# Patient Record
Sex: Female | Born: 1965 | Race: White | Hispanic: No | Marital: Married | Smoking: Never smoker
Health system: Southern US, Community
[De-identification: ages and names within clinical notes are randomized; demographics above are authoritative.]

## PROBLEM LIST (undated history)

## (undated) DIAGNOSIS — J069 Acute upper respiratory infection, unspecified: Secondary | ICD-10-CM

## (undated) DIAGNOSIS — T7840XA Allergy, unspecified, initial encounter: Secondary | ICD-10-CM

## (undated) DIAGNOSIS — J45909 Unspecified asthma, uncomplicated: Secondary | ICD-10-CM

## (undated) DIAGNOSIS — C50919 Malignant neoplasm of unspecified site of unspecified female breast: Secondary | ICD-10-CM

## (undated) HISTORY — DX: Allergy, unspecified, initial encounter: T78.40XA

## (undated) HISTORY — DX: Acute upper respiratory infection, unspecified: J06.9

## (undated) HISTORY — PX: COLONOSCOPY: SHX174

## (undated) HISTORY — PX: MASTECTOMY: SHX3

## (undated) HISTORY — DX: Unspecified asthma, uncomplicated: J45.909

## (undated) HISTORY — DX: Malignant neoplasm of unspecified site of unspecified female breast: C50.919

---

## 2004-03-05 DIAGNOSIS — C50919 Malignant neoplasm of unspecified site of unspecified female breast: Secondary | ICD-10-CM

## 2004-03-05 HISTORY — DX: Malignant neoplasm of unspecified site of unspecified female breast: C50.919

## 2007-01-08 ENCOUNTER — Other Ambulatory Visit: Admission: RE | Admit: 2007-01-08 | Discharge: 2007-01-08 | Payer: Self-pay | Admitting: Gynecology

## 2007-12-15 ENCOUNTER — Other Ambulatory Visit: Admission: RE | Admit: 2007-12-15 | Discharge: 2007-12-15 | Payer: Self-pay | Admitting: Gynecology

## 2009-03-31 ENCOUNTER — Encounter: Admission: RE | Admit: 2009-03-31 | Discharge: 2009-03-31 | Payer: Self-pay | Admitting: Gynecology

## 2016-08-20 DIAGNOSIS — M23301 Other meniscus derangements, unspecified lateral meniscus, left knee: Secondary | ICD-10-CM | POA: Diagnosis not present

## 2016-08-20 DIAGNOSIS — M25562 Pain in left knee: Secondary | ICD-10-CM | POA: Diagnosis not present

## 2016-09-07 DIAGNOSIS — M25562 Pain in left knee: Secondary | ICD-10-CM | POA: Diagnosis not present

## 2016-09-11 DIAGNOSIS — M25562 Pain in left knee: Secondary | ICD-10-CM | POA: Diagnosis not present

## 2016-09-14 DIAGNOSIS — M25562 Pain in left knee: Secondary | ICD-10-CM | POA: Diagnosis not present

## 2016-09-17 DIAGNOSIS — M25562 Pain in left knee: Secondary | ICD-10-CM | POA: Diagnosis not present

## 2016-09-19 DIAGNOSIS — M25562 Pain in left knee: Secondary | ICD-10-CM | POA: Diagnosis not present

## 2016-09-24 DIAGNOSIS — M25562 Pain in left knee: Secondary | ICD-10-CM | POA: Diagnosis not present

## 2016-09-26 DIAGNOSIS — M25562 Pain in left knee: Secondary | ICD-10-CM | POA: Diagnosis not present

## 2016-10-01 DIAGNOSIS — M25562 Pain in left knee: Secondary | ICD-10-CM | POA: Diagnosis not present

## 2016-10-04 DIAGNOSIS — M25562 Pain in left knee: Secondary | ICD-10-CM | POA: Diagnosis not present

## 2016-10-11 DIAGNOSIS — M25562 Pain in left knee: Secondary | ICD-10-CM | POA: Diagnosis not present

## 2016-10-15 DIAGNOSIS — M25562 Pain in left knee: Secondary | ICD-10-CM | POA: Diagnosis not present

## 2016-10-17 DIAGNOSIS — M25562 Pain in left knee: Secondary | ICD-10-CM | POA: Diagnosis not present

## 2016-10-22 DIAGNOSIS — M25562 Pain in left knee: Secondary | ICD-10-CM | POA: Diagnosis not present

## 2016-10-25 DIAGNOSIS — M25562 Pain in left knee: Secondary | ICD-10-CM | POA: Diagnosis not present

## 2016-10-29 DIAGNOSIS — M25562 Pain in left knee: Secondary | ICD-10-CM | POA: Diagnosis not present

## 2016-11-01 DIAGNOSIS — M25562 Pain in left knee: Secondary | ICD-10-CM | POA: Diagnosis not present

## 2016-11-06 DIAGNOSIS — M25562 Pain in left knee: Secondary | ICD-10-CM | POA: Diagnosis not present

## 2016-11-09 DIAGNOSIS — M25562 Pain in left knee: Secondary | ICD-10-CM | POA: Diagnosis not present

## 2016-11-12 DIAGNOSIS — M25562 Pain in left knee: Secondary | ICD-10-CM | POA: Diagnosis not present

## 2016-11-16 DIAGNOSIS — M25562 Pain in left knee: Secondary | ICD-10-CM | POA: Diagnosis not present

## 2016-11-20 DIAGNOSIS — M25562 Pain in left knee: Secondary | ICD-10-CM | POA: Diagnosis not present

## 2016-11-20 DIAGNOSIS — Z23 Encounter for immunization: Secondary | ICD-10-CM | POA: Diagnosis not present

## 2016-11-23 DIAGNOSIS — M25562 Pain in left knee: Secondary | ICD-10-CM | POA: Diagnosis not present

## 2016-11-26 DIAGNOSIS — M25562 Pain in left knee: Secondary | ICD-10-CM | POA: Diagnosis not present

## 2016-11-30 DIAGNOSIS — M25562 Pain in left knee: Secondary | ICD-10-CM | POA: Diagnosis not present

## 2016-12-03 DIAGNOSIS — M25562 Pain in left knee: Secondary | ICD-10-CM | POA: Diagnosis not present

## 2016-12-06 DIAGNOSIS — M25562 Pain in left knee: Secondary | ICD-10-CM | POA: Diagnosis not present

## 2016-12-10 DIAGNOSIS — M25562 Pain in left knee: Secondary | ICD-10-CM | POA: Diagnosis not present

## 2016-12-14 DIAGNOSIS — M25562 Pain in left knee: Secondary | ICD-10-CM | POA: Diagnosis not present

## 2016-12-18 DIAGNOSIS — M25562 Pain in left knee: Secondary | ICD-10-CM | POA: Diagnosis not present

## 2016-12-21 DIAGNOSIS — M25562 Pain in left knee: Secondary | ICD-10-CM | POA: Diagnosis not present

## 2017-02-18 DIAGNOSIS — Z01419 Encounter for gynecological examination (general) (routine) without abnormal findings: Secondary | ICD-10-CM | POA: Diagnosis not present

## 2017-02-18 DIAGNOSIS — Z1212 Encounter for screening for malignant neoplasm of rectum: Secondary | ICD-10-CM | POA: Diagnosis not present

## 2017-02-18 DIAGNOSIS — Z6824 Body mass index (BMI) 24.0-24.9, adult: Secondary | ICD-10-CM | POA: Diagnosis not present

## 2017-03-14 DIAGNOSIS — Z800 Family history of malignant neoplasm of digestive organs: Secondary | ICD-10-CM | POA: Diagnosis not present

## 2017-03-14 DIAGNOSIS — Z808 Family history of malignant neoplasm of other organs or systems: Secondary | ICD-10-CM | POA: Diagnosis not present

## 2017-03-14 DIAGNOSIS — Z803 Family history of malignant neoplasm of breast: Secondary | ICD-10-CM | POA: Diagnosis not present

## 2017-03-14 DIAGNOSIS — Z853 Personal history of malignant neoplasm of breast: Secondary | ICD-10-CM | POA: Diagnosis not present

## 2017-04-25 DIAGNOSIS — Z809 Family history of malignant neoplasm, unspecified: Secondary | ICD-10-CM | POA: Diagnosis not present

## 2017-06-06 DIAGNOSIS — D225 Melanocytic nevi of trunk: Secondary | ICD-10-CM | POA: Diagnosis not present

## 2017-06-06 DIAGNOSIS — Z08 Encounter for follow-up examination after completed treatment for malignant neoplasm: Secondary | ICD-10-CM | POA: Diagnosis not present

## 2017-06-06 DIAGNOSIS — Z85828 Personal history of other malignant neoplasm of skin: Secondary | ICD-10-CM | POA: Diagnosis not present

## 2017-06-17 DIAGNOSIS — Z0000 Encounter for general adult medical examination without abnormal findings: Secondary | ICD-10-CM | POA: Diagnosis not present

## 2017-06-17 DIAGNOSIS — Z23 Encounter for immunization: Secondary | ICD-10-CM | POA: Diagnosis not present

## 2017-06-17 DIAGNOSIS — Z800 Family history of malignant neoplasm of digestive organs: Secondary | ICD-10-CM | POA: Diagnosis not present

## 2017-06-17 DIAGNOSIS — Z853 Personal history of malignant neoplasm of breast: Secondary | ICD-10-CM | POA: Diagnosis not present

## 2017-06-17 DIAGNOSIS — J45909 Unspecified asthma, uncomplicated: Secondary | ICD-10-CM | POA: Diagnosis not present

## 2017-08-20 DIAGNOSIS — Z111 Encounter for screening for respiratory tuberculosis: Secondary | ICD-10-CM | POA: Diagnosis not present

## 2018-04-07 DIAGNOSIS — Z01419 Encounter for gynecological examination (general) (routine) without abnormal findings: Secondary | ICD-10-CM | POA: Diagnosis not present

## 2018-04-07 DIAGNOSIS — Z6824 Body mass index (BMI) 24.0-24.9, adult: Secondary | ICD-10-CM | POA: Diagnosis not present

## 2018-04-09 DIAGNOSIS — R509 Fever, unspecified: Secondary | ICD-10-CM | POA: Diagnosis not present

## 2018-04-23 DIAGNOSIS — B349 Viral infection, unspecified: Secondary | ICD-10-CM | POA: Diagnosis not present

## 2018-06-20 DIAGNOSIS — J4530 Mild persistent asthma, uncomplicated: Secondary | ICD-10-CM | POA: Diagnosis not present

## 2018-06-20 DIAGNOSIS — F5104 Psychophysiologic insomnia: Secondary | ICD-10-CM | POA: Diagnosis not present

## 2018-06-20 DIAGNOSIS — Z853 Personal history of malignant neoplasm of breast: Secondary | ICD-10-CM | POA: Diagnosis not present

## 2018-06-20 DIAGNOSIS — J3089 Other allergic rhinitis: Secondary | ICD-10-CM | POA: Diagnosis not present

## 2018-06-23 DIAGNOSIS — H8110 Benign paroxysmal vertigo, unspecified ear: Secondary | ICD-10-CM | POA: Diagnosis not present

## 2018-06-23 DIAGNOSIS — F5104 Psychophysiologic insomnia: Secondary | ICD-10-CM | POA: Diagnosis not present

## 2018-06-30 ENCOUNTER — Other Ambulatory Visit: Payer: Self-pay

## 2018-06-30 ENCOUNTER — Ambulatory Visit (INDEPENDENT_AMBULATORY_CARE_PROVIDER_SITE_OTHER): Payer: BLUE CROSS/BLUE SHIELD | Admitting: Pediatrics

## 2018-06-30 ENCOUNTER — Encounter: Payer: Self-pay | Admitting: Pediatrics

## 2018-06-30 VITALS — BP 90/60 | HR 71 | Temp 98.2°F | Ht 66.4 in | Wt 155.6 lb

## 2018-06-30 DIAGNOSIS — J301 Allergic rhinitis due to pollen: Secondary | ICD-10-CM | POA: Diagnosis not present

## 2018-06-30 DIAGNOSIS — T63441A Toxic effect of venom of bees, accidental (unintentional), initial encounter: Secondary | ICD-10-CM | POA: Insufficient documentation

## 2018-06-30 DIAGNOSIS — J4530 Mild persistent asthma, uncomplicated: Secondary | ICD-10-CM | POA: Insufficient documentation

## 2018-06-30 DIAGNOSIS — Z853 Personal history of malignant neoplasm of breast: Secondary | ICD-10-CM | POA: Diagnosis not present

## 2018-06-30 DIAGNOSIS — T63441D Toxic effect of venom of bees, accidental (unintentional), subsequent encounter: Secondary | ICD-10-CM | POA: Diagnosis not present

## 2018-06-30 MED ORDER — FLUTICASONE-SALMETEROL 100-50 MCG/DOSE IN AEPB: INHALATION_SPRAY | RESPIRATORY_TRACT | 5 refills | Status: DC

## 2018-06-30 MED ORDER — ALBUTEROL SULFATE HFA 108 (90 BASE) MCG/ACT IN AERS: INHALATION_SPRAY | RESPIRATORY_TRACT | 3 refills | Status: DC

## 2018-06-30 MED ORDER — FLUTICASONE PROPIONATE 50 MCG/ACT NA SUSP: NASAL | 5 refills | Status: DC

## 2018-06-30 MED ORDER — EPINEPHRINE 0.3 MG/0.3ML IJ SOAJ: INTRAMUSCULAR | 0 refills | Status: DC

## 2018-06-30 MED ORDER — MONTELUKAST SODIUM 10 MG PO TABS: ORAL_TABLET | ORAL | 5 refills | Status: DC

## 2018-06-30 NOTE — Patient Instructions (Addendum)
Environmental control of dust mite Zyrtec 10 mg-take 1 tablet once a day for runny nose or itchy eyes Fluticasone 2 sprays per nostril once a day if needed for stuffy nose Opcon-A-1 drop 3 times a day if needed for itchy eyes Montelukast 10 mg-take 1 tablet once a day to prevent coughing or wheezing Wixela  inhaler 100/50-1 puff every 12 hours to prevent coughing or wheezing Pro-air 2 puffs every 4 hours if needed for wheezing or coughing spells.  You may use Pro-air 2 puffs 5 to 15 minutes before exercise Continue on your other medications Call us if you are not doing well on this treatment plan I will call you with the results of your blood work for insect allergy If your symptoms get worse, add prednisone 10 mg twice a day for 4 days, 10 mg on the fifth day  If you have  an allergic reaction take Benadryl 50 mg every 4 hours and if you  has life-threatening symptoms inject with EpiPen 0.3 mg

## 2018-06-30 NOTE — Progress Notes (Signed)
[address] [place] [address] Dept: [phone]  New Patient Note  Patient ID: [name], female    DOB: [birth year]  Age: 53 y.o. MRN: [id number] Date of Office Visit: 06/30/2018 Referring provider: No referring provider defined for this encounter.    Chief Complaint: Establish Care (wants to see if anything has changed)  HPI [employer] presents for an allergy evaluation.  She has had asthma for over 30 years.  She has had seasonal allergic rhinitis since childhood.  She has aggravation of her nasal symptoms on exposure to dust, cigarette smoke, the springtime of the year and possibly cats and dogs.. At times  she has itchy eyes.  She has never had pneumonia , gastroesophageal reflux, eczema or food allergies.  She is on Zyrtec 10 mg once a day, montelukast 10 mg once a day and Wixela  100-50-one  puff once a day on a daily basis.  She had breast cancer in 2006 and had a bilateral mastectomy.  She did not need chemotherapy or radiation   Review of Systems  Constitutional: Negative.   HENT:       Seasonal allergic rhinitis since childhood  Eyes:       Itch in the springtime  Respiratory:       Asthma for over 20 years  Cardiovascular: Negative.   Gastrointestinal: Negative.   Genitourinary:       Bilateral mastectomy 2006 for breast cancer.  No chemotherapy or radiation needed  Musculoskeletal:       Knee arthroscopy 2018  Skin:       Hives from an insect sting  at age 84  Neurological: Negative.   Endo/Heme/Allergies:       No diabetes or thyroid disease  Psychiatric/Behavioral: Negative.     Outpatient Encounter Medications as of 06/30/2018  Medication Sig  . Cetirizine HCl (ZYRTEC ALLERGY) 10 MG CAPS Zyrtec 10 mg capsule  Take by oral route.  Marland Kitchen EPINEPHrine 0.3 mg/0.3 mL IJ SOAJ injection For life-threatening symptoms inject.  . Fluticasone-Salmeterol (WIXELA INHUB) 100-50 MCG/DOSE AEPB 1 puff every 12 hours to prevent coughing or wheezing.  . mometasone  (NASONEX) 50 MCG/ACT nasal spray 2 sprays by Each Nare route daily as needed.  . montelukast (SINGULAIR) 10 MG tablet Take 1 tablet once a day to prevent coughing or wheezing.  . [DISCONTINUED] EPINEPHrine 0.3 mg/0.3 mL IJ SOAJ injection epinephrine 0.3 mg/0.3 mL injection, auto-injector  ADM 0.3 ML [place] 1 TIME UTD FOR SEVERE ALLERGIC REACTION  . [DISCONTINUED] Fluticasone-Salmeterol (WIXELA INHUB) 100-50 MCG/DOSE AEPB Wixela Inhub 100 mcg-50 mcg/dose powder for inhalation  . [DISCONTINUED] montelukast (SINGULAIR) 10 MG tablet   . albuterol (PROAIR HFA) 108 (90 Base) MCG/ACT inhaler 2 puffs every 4 hours if needed for wheezing or coughing spells.  . fluticasone (FLONASE) 50 MCG/ACT nasal spray 2 sprays per nostril once a day if needed for stuffy nose.   No facility-administered encounter medications on file as of 06/30/2018.      Drug Allergies:  Allergies  Allergen Reactions  . Bee Venom Hives and Rash    Family History: Yessika's family history includes Allergic rhinitis in her father; Asthma in her father and maternal grandmother..  Family history is positive for food allergies and eczema.  Family history is negative for angioedema, chronic urticaria , chronic bronchitis or emphysema.  Social and environmental.  They have a poodle  at home.  She is not exposed to cigarette smoking.  She has not smoked cigarettes in the past.  She plays golf as a hobby.  She is not employed  Physical Exam: BP 90/60 (BP Location: Left Arm, Patient Position: Sitting, Cuff Size: Normal)   Pulse 71   Temp 98.2 F (36.8 C) (Oral)   Ht 5' 6.4" (1.687 m)   Wt 155 lb 9.6 oz (70.6 kg)   SpO2 96%   BMI 24.81 kg/m    Physical Exam Constitutional:      Appearance: Normal appearance. She is normal weight.  HENT:     Head:     Comments: Eyes normal.  Ears normal.  Nose  mild swelling of the nasal turbinates.  Pharynx normal. Neck:     Musculoskeletal: Neck supple.     Comments: No thyromegaly Pulmonary:      Comments: Clear to percussion and auscultation Abdominal:     Palpations: Abdomen is soft.     Tenderness: There is no abdominal tenderness.     Comments: No hepatosplenomegaly  Lymphadenopathy:     Cervical: No cervical adenopathy.  Skin:    Comments: Clear  Neurological:     General: No focal deficit present.     Mental Status: She is alert and oriented to person, place, and time.  Psychiatric:        Mood and Affect: Mood normal.        Behavior: Behavior normal.        Thought Content: Thought content normal.        Judgment: Judgment normal.     Diagnostics: FVC 3.29 L FEV1 2.81 L.  Predicted FVC 3.79 L predicted FEV1 2.99 L.  After albuterol 2 puffs FVC 3.46 L FEV1 2.97 L the spirometry is in the normal range and there was no significant improvement after albuterol  Allergy skin test were positive to grass pollen, weeds, tree pollens, cat, dust mite   Assessment  Assessment and Plan: 1. Toxic effect of venom of bees, unintentional, subsequent encounter   2. Mild persistent asthma without complication   3. Seasonal allergic rhinitis due to pollen   4. History of breast cancer     Meds ordered this encounter  Medications  . fluticasone (FLONASE) 50 MCG/ACT nasal spray    Sig: 2 sprays per nostril once a day if needed for stuffy nose.    Dispense:  16 g    Refill:  5  . montelukast (SINGULAIR) 10 MG tablet    Sig: Take 1 tablet once a day to prevent coughing or wheezing.    Dispense:  30 tablet    Refill:  5  . Fluticasone-Salmeterol (WIXELA INHUB) 100-50 MCG/DOSE AEPB    Sig: 1 puff every 12 hours to prevent coughing or wheezing.    Dispense:  60 each    Refill:  5  . albuterol (PROAIR HFA) 108 (90 Base) MCG/ACT inhaler    Sig: 2 puffs every 4 hours if needed for wheezing or coughing spells.    Dispense:  1 Inhaler    Refill:  3  . EPINEPHrine 0.3 mg/0.3 mL IJ SOAJ injection    Sig: For life-threatening symptoms inject.    Dispense:  2 Device    Refill:  0     Patient Instructions  Environmental control of dust mite Zyrtec 10 mg-take 1 tablet once a day for runny nose or itchy eyes Fluticasone 2 sprays per nostril once a day if needed for stuffy nose Opcon-A-1 drop 3 times a day if needed for itchy eyes Montelukast 10 mg-take 1 tablet once a day to  prevent coughing or wheezing Wixela  inhaler 100/50-1 puff every 12 hours to prevent coughing or wheezing Pro-air 2 puffs every 4 hours if needed for wheezing or coughing spells.  You may use Pro-air 2 puffs 5 to 15 minutes before exercise Continue on your other medications Call us if you are not doing well on this treatment plan I will call you with the results of your blood work for insect allergy If your symptoms get worse, add prednisone 10 mg twice a day for 4 days, 10 mg on the fifth day  If you have  an allergic reaction take Benadryl 50 mg every 4 hours and if you  has life-threatening symptoms inject with EpiPen 0.3 mg   Return in about 6 weeks (around 08/11/2018).   Thank you for the opportunity to care for this patient.  Please do not hesitate to contact me with questions.  Penne Lash, M.D.  [facility] [address] [place], [place] [address] [phone]

## 2018-07-02 LAB — ALLERGEN HYMENOPTERA PANEL
Bumblebee: <0.1 kU/L
Honeybee IgE: <0.1 kU/L
Hornet, White Face, IgE: 0.16 kU/L — AB
Hornet, Yellow, IgE: <0.1 kU/L
Paper Wasp IgE: 0.11 kU/L — AB
Yellow Jacket, IgE: 0.48 kU/L — AB

## 2018-10-07 ENCOUNTER — Other Ambulatory Visit: Payer: Self-pay | Admitting: *Deleted

## 2018-10-07 NOTE — Telephone Encounter (Signed)
Received PA for wixela 100-50 mcg. Spoke with pharmacy and her ins prefers brand Advair now which will be $10. Pt made aware of change.

## 2019-02-12 ENCOUNTER — Other Ambulatory Visit: Payer: Self-pay | Admitting: Pediatrics

## 2019-02-12 DIAGNOSIS — J301 Allergic rhinitis due to pollen: Secondary | ICD-10-CM

## 2019-02-12 DIAGNOSIS — L570 Actinic keratosis: Secondary | ICD-10-CM | POA: Diagnosis not present

## 2019-02-12 DIAGNOSIS — D1801 Hemangioma of skin and subcutaneous tissue: Secondary | ICD-10-CM | POA: Diagnosis not present

## 2019-02-12 DIAGNOSIS — L821 Other seborrheic keratosis: Secondary | ICD-10-CM | POA: Diagnosis not present

## 2019-02-12 DIAGNOSIS — D225 Melanocytic nevi of trunk: Secondary | ICD-10-CM | POA: Diagnosis not present

## 2019-02-12 DIAGNOSIS — D485 Neoplasm of uncertain behavior of skin: Secondary | ICD-10-CM | POA: Diagnosis not present

## 2019-02-12 DIAGNOSIS — D2271 Melanocytic nevi of right lower limb, including hip: Secondary | ICD-10-CM | POA: Diagnosis not present

## 2019-03-09 DIAGNOSIS — D485 Neoplasm of uncertain behavior of skin: Secondary | ICD-10-CM | POA: Diagnosis not present

## 2019-03-09 DIAGNOSIS — L988 Other specified disorders of the skin and subcutaneous tissue: Secondary | ICD-10-CM | POA: Diagnosis not present

## 2019-03-19 DIAGNOSIS — Z4802 Encounter for removal of sutures: Secondary | ICD-10-CM | POA: Diagnosis not present

## 2019-03-27 ENCOUNTER — Ambulatory Visit: Payer: BC Managed Care – PPO | Attending: Internal Medicine

## 2019-03-27 DIAGNOSIS — Z23 Encounter for immunization: Secondary | ICD-10-CM | POA: Insufficient documentation

## 2019-03-27 NOTE — Progress Notes (Signed)
   Covid-19 Vaccination Clinic  Name:  Vanessa Goodman    MRN: [id number] DOB: 01/23/1966  03/27/2019  Vanessa Goodman was observed post Covid-19 immunization for 15 minutes without incidence. She was provided with Vaccine Information Sheet and instruction to access the V-Safe system.   Vanessa Goodman was instructed to call 911 with any severe reactions post vaccine: Marland Kitchen Difficulty breathing  . Swelling of your face and throat  . A fast heartbeat  . A bad rash all over your body  . Dizziness and weakness    Immunizations Administered    Name Date Dose VIS Date Route   Pfizer COVID-19 Vaccine 03/27/2019 12:26 PM 0.3 mL 02/13/2019 Intramuscular   Manufacturer: [place]   Lot: [id number]   [place]: [id number]

## 2019-04-06 DIAGNOSIS — N921 Excessive and frequent menstruation with irregular cycle: Secondary | ICD-10-CM | POA: Diagnosis not present

## 2019-04-06 DIAGNOSIS — Z0000 Encounter for general adult medical examination without abnormal findings: Secondary | ICD-10-CM | POA: Diagnosis not present

## 2019-04-06 DIAGNOSIS — J4530 Mild persistent asthma, uncomplicated: Secondary | ICD-10-CM | POA: Diagnosis not present

## 2019-04-06 DIAGNOSIS — Z13220 Encounter for screening for lipoid disorders: Secondary | ICD-10-CM | POA: Diagnosis not present

## 2019-04-06 DIAGNOSIS — J3089 Other allergic rhinitis: Secondary | ICD-10-CM | POA: Diagnosis not present

## 2019-04-06 DIAGNOSIS — Z853 Personal history of malignant neoplasm of breast: Secondary | ICD-10-CM | POA: Diagnosis not present

## 2019-04-18 ENCOUNTER — Ambulatory Visit: Payer: BC Managed Care – PPO | Attending: Internal Medicine

## 2019-04-18 DIAGNOSIS — Z23 Encounter for immunization: Secondary | ICD-10-CM | POA: Insufficient documentation

## 2019-04-18 NOTE — Progress Notes (Signed)
   Covid-19 Vaccination Clinic  Name:  Nuria Wittich    MRN: [id number] DOB: November 03, 1965  04/18/2019  Ms. Slocumb was observed post Covid-19 immunization for 15 minutes without incidence. She was provided with Vaccine Information Sheet and instruction to access the V-Safe system.   Ms. Aispuro was instructed to call 911 with any severe reactions post vaccine: Marland Kitchen Difficulty breathing  . Swelling of your face and throat  . A fast heartbeat  . A bad rash all over your body  . Dizziness and weakness    Immunizations Administered    Name Date Dose VIS Date Route   Pfizer COVID-19 Vaccine 04/18/2019 10:56 AM 0.3 mL 02/13/2019 Intramuscular   Manufacturer: [place]   Lot: [id number]   [place]: [id number]

## 2019-04-28 DIAGNOSIS — Z6825 Body mass index (BMI) 25.0-25.9, adult: Secondary | ICD-10-CM | POA: Diagnosis not present

## 2019-04-28 DIAGNOSIS — Z01419 Encounter for gynecological examination (general) (routine) without abnormal findings: Secondary | ICD-10-CM | POA: Diagnosis not present

## 2019-04-28 DIAGNOSIS — N920 Excessive and frequent menstruation with regular cycle: Secondary | ICD-10-CM | POA: Diagnosis not present

## 2019-05-07 DIAGNOSIS — N840 Polyp of corpus uteri: Secondary | ICD-10-CM | POA: Diagnosis not present

## 2019-05-07 DIAGNOSIS — N924 Excessive bleeding in the premenopausal period: Secondary | ICD-10-CM | POA: Diagnosis not present

## 2019-05-14 DIAGNOSIS — N920 Excessive and frequent menstruation with regular cycle: Secondary | ICD-10-CM | POA: Diagnosis not present

## 2019-05-14 DIAGNOSIS — N950 Postmenopausal bleeding: Secondary | ICD-10-CM | POA: Diagnosis not present

## 2019-05-14 DIAGNOSIS — N840 Polyp of corpus uteri: Secondary | ICD-10-CM | POA: Diagnosis not present

## 2019-05-18 DIAGNOSIS — D509 Iron deficiency anemia, unspecified: Secondary | ICD-10-CM | POA: Diagnosis not present

## 2019-05-18 DIAGNOSIS — Z1211 Encounter for screening for malignant neoplasm of colon: Secondary | ICD-10-CM | POA: Diagnosis not present

## 2019-05-28 ENCOUNTER — Other Ambulatory Visit: Payer: Self-pay

## 2019-05-28 DIAGNOSIS — J301 Allergic rhinitis due to pollen: Secondary | ICD-10-CM

## 2019-05-28 MED ORDER — MONTELUKAST SODIUM 10 MG PO TABS: ORAL_TABLET | ORAL | 0 refills | Status: DC

## 2019-06-01 ENCOUNTER — Encounter: Payer: Self-pay | Admitting: Family Medicine

## 2019-06-01 ENCOUNTER — Other Ambulatory Visit: Payer: Self-pay

## 2019-06-01 ENCOUNTER — Ambulatory Visit: Payer: BC Managed Care – PPO | Admitting: Family Medicine

## 2019-06-01 VITALS — BP 120/72 | HR 73 | Temp 96.8°F | Resp 18 | Ht 67.5 in | Wt 160.0 lb

## 2019-06-01 DIAGNOSIS — J4530 Mild persistent asthma, uncomplicated: Secondary | ICD-10-CM

## 2019-06-01 DIAGNOSIS — J301 Allergic rhinitis due to pollen: Secondary | ICD-10-CM

## 2019-06-01 DIAGNOSIS — H1010 Acute atopic conjunctivitis, unspecified eye: Secondary | ICD-10-CM | POA: Diagnosis not present

## 2019-06-01 DIAGNOSIS — T63441D Toxic effect of venom of bees, accidental (unintentional), subsequent encounter: Secondary | ICD-10-CM | POA: Diagnosis not present

## 2019-06-01 DIAGNOSIS — Z853 Personal history of malignant neoplasm of breast: Secondary | ICD-10-CM

## 2019-06-01 MED ORDER — MONTELUKAST SODIUM 10 MG PO TABS: ORAL_TABLET | ORAL | 5 refills | Status: DC

## 2019-06-01 MED ORDER — FLUTICASONE-SALMETEROL 100-50 MCG/DOSE IN AEPB: INHALATION_SPRAY | RESPIRATORY_TRACT | 5 refills | Status: DC

## 2019-06-01 NOTE — Progress Notes (Signed)
[place] [id number] Dept: [phone]  FOLLOW UP NOTE  Patient ID: Vanessa Goodman, female    DOB: 22-Feb-1966  Age: 54 y.o. MRN: [id number] Date of Office Visit: 06/01/2019  Assessment  Chief Complaint: Asthma  HPI Vanessa Goodman is a 54 year old female who presents to the clinic for follow-up visit.  She was last seen in this clinic on 06/30/2018 by Dr. Larrie Kass evaluation of asthma, allergic rhinitis, allergic conjunctivitis, insect allergy, and allergic reaction with unknown trigger.  At today's visit, she reports her asthma has been well controlled with no shortness of breath, cough, or wheeze with activity or rest.  She continues montelukast 10 mg daily Wixela100-1 puff once a night and has not needed her albuterol since her last visit to this clinic.  Allergic rhinitis is reported as well controlled with cetirizine daily and Flonase and saline rinses as needed.  Allergic conjunctivitis is reported as moderately well controlled with no allergy eyedrop use at this time.  She continues to avoid stinging insects and has not had a sting or needed treatment since her last visit to this clinic.  She reports she has not had an allergic reaction of any kind since her last visit to this clinic nor has she needed to use her EpiPen.  Her current medications are listed in the chart.   Drug Allergies:  Allergies  Allergen Reactions  . Bee Venom Hives and Rash    Physical Exam: BP 120/72   Pulse 73   Temp (!) 96.8 F (36 C) (Temporal)   Resp 18   Ht 5' 7.5" (1.715 m)   Wt 160 lb (72.6 kg)   SpO2 95%   BMI 24.69 kg/m    Physical Exam Vitals reviewed.  Constitutional:      Appearance: Normal appearance.  HENT:     Head: Normocephalic and atraumatic.     Right Ear: Tympanic membrane normal.     Left Ear: Tympanic membrane normal.     Nose:     Comments: Bilateral nares slightly erythematous with no nasal drainage noted.  Slight nasal deviation noted.  Pharynx slightly  erythematous with no exudate.  Ears normal.  Eyes normal. Eyes:     Conjunctiva/sclera: Conjunctivae normal.  Cardiovascular:     Rate and Rhythm: Normal rate and regular rhythm.     Heart sounds: Normal heart sounds. No murmur.  Pulmonary:     Effort: Pulmonary effort is normal.     Breath sounds: Normal breath sounds.     Comments: Lungs clear to auscultation Musculoskeletal:        General: Normal range of motion.     Cervical back: Normal range of motion and neck supple.  Skin:    General: Skin is warm and dry.  Neurological:     Mental Status: She is alert and oriented to person, place, and time.  Psychiatric:        Mood and Affect: Mood normal.        Behavior: Behavior normal.        Thought Content: Thought content normal.        Judgment: Judgment normal.     Diagnostics: FVC 3.40, FEV1 2.90.  Predicted FVC 4.02, predicted FEV1 3.16.  Spirometry indicates normal ventilatory function.  Assessment and Plan: 1. Mild persistent asthma without complication   2. Seasonal allergic rhinitis due to pollen   3. Seasonal allergic conjunctivitis   4. Toxic effect of venom of bees, unintentional, subsequent encounter   5.  History of breast cancer     Meds ordered this encounter  Medications  . Fluticasone-Salmeterol (WIXELA INHUB) 100-50 MCG/DOSE AEPB    Sig: 1 puff every 12 hours to prevent coughing or wheezing.    Dispense:  60 each    Refill:  5  . montelukast (SINGULAIR) 10 MG tablet    Sig: Take 1 tablet once a day to prevent coughing or wheezing.    Dispense:  30 tablet    Refill:  5    Patient Instructions  Asthma Continue montelukast 10 mg-take 1 tablet once a day to prevent coughing or wheezing Continue Wixela 100-1 puff once a day to prevent coughing or wheezing Continue Pro-air 2 puffs every 4 hours if needed for wheezing or coughing spells.  You may use Pro-air 2 puffs 5 to 15 minutes before exercise Continue on your other medications  Allergic  rhinitis Continue cetirizine 10 mg-take 1 tablet once a day as needed for runny nose or itchy eyes Continue Fluticasone 2 sprays per nostril once a day if needed for stuffy nose Consider saline nasal rinses as needed for nasal symptoms. Use this before any medicated nasal sprays for best result Continue allergen avoidance measures directed toward, grass pollen, tree pollen, weed pollen, cat, and dust mite  Allergic conjunctivitis Begin Opcon-A-1 drop 3 times a day if needed for red or itchy eyes  Stinging insects Avoid stinging insects. In case of an allergic reaction, take Benadryl 50 mg every 4 hours, and if life-threatening symptoms occur, inject with EpiPen 0.3 mg.  Call the clinic if this treatment plan is not working well for you  Follow up in 6 months or sooner if needed.   Return in about 6 months (around 12/02/2019), or if symptoms worsen or fail to improve.   Thank you for the opportunity to care for this patient.  Please do not hesitate to contact me with questions.  Gareth Morgan, FNP [facility]  I have provided oversight concerning Gareth Morgan' evaluation and treatment of this patient's health issues addressed during today's encounter. I agree with the assessment and therapeutic plan as outlined in the note.    Thank you for the opportunity to care for this patient.  Please do not hesitate to contact me with questions.  Penne Lash, M.D.  [facility] [address] [place], [place] [address] [phone]

## 2019-06-01 NOTE — Patient Instructions (Addendum)
Asthma Continue montelukast 10 mg-take 1 tablet once a day to prevent coughing or wheezing Continue Wixela 100-1 puff once a day to prevent coughing or wheezing Continue Pro-air 2 puffs every 4 hours if needed for wheezing or coughing spells.  You may use Pro-air 2 puffs 5 to 15 minutes before exercise Continue on your other medications  Allergic rhinitis Continue cetirizine 10 mg-take 1 tablet once a day as needed for runny nose or itchy eyes Continue Fluticasone 2 sprays per nostril once a day if needed for stuffy nose Consider saline nasal rinses as needed for nasal symptoms. Use this before any medicated nasal sprays for best result Continue allergen avoidance measures directed toward, grass pollen, tree pollen, weed pollen, cat, and dust mite  Allergic conjunctivitis Begin Opcon-A-1 drop 3 times a day if needed for red or itchy eyes  Stinging insects Avoid stinging insects. In case of an allergic reaction, take Benadryl 50 mg every 4 hours, and if life-threatening symptoms occur, inject with EpiPen 0.3 mg.  Call the clinic if this treatment plan is not working well for you  Follow up in 6 months or sooner if needed.

## 2019-06-03 ENCOUNTER — Telehealth: Payer: Self-pay

## 2019-06-03 NOTE — Telephone Encounter (Signed)
Fax received today from [facility].  Wixela Inhub 100-50 mcg is approved.  Authorization effective dates: 06/01/2029 - 05/31/2020  Under pharmacy benefits.

## 2019-06-07 ENCOUNTER — Other Ambulatory Visit: Payer: Self-pay | Admitting: Pediatrics

## 2019-06-07 DIAGNOSIS — J4530 Mild persistent asthma, uncomplicated: Secondary | ICD-10-CM

## 2019-06-29 ENCOUNTER — Other Ambulatory Visit: Payer: Self-pay

## 2019-06-29 DIAGNOSIS — J301 Allergic rhinitis due to pollen: Secondary | ICD-10-CM

## 2019-06-29 MED ORDER — MONTELUKAST SODIUM 10 MG PO TABS: ORAL_TABLET | ORAL | 5 refills | Status: DC

## 2019-11-06 ENCOUNTER — Ambulatory Visit: Payer: BC Managed Care – PPO | Admitting: Family Medicine

## 2019-12-08 NOTE — Patient Instructions (Addendum)
Asthma Continue montelukast 10 mg-take 1 tablet once a day to prevent coughing or wheezing Continue Advair Diskus 100-50. Take 1 puff once a day to prevent coughing or wheezing Continue albuterol 2 puffs every 4 hours if needed for wheezing or coughing spells.   You may use albuterol 2 puffs 5 to 15 minutes before exercise  Allergic rhinitis Continue cetirizine 10 mg-take 1 tablet once a day as needed for runny nose or itchy eyes Continue Flonase 2 sprays per nostril once a day if needed for stuffy nose Consider saline nasal rinses as needed for nasal symptoms. Use this before any medicated nasal sprays for best result Continue allergen avoidance measures directed toward, grass pollen, tree pollen, weed pollen, cat, and dust mite as listed below  Allergic conjunctivitis Some over the counter eye drops include Pataday one drop in each eye once a day as needed for red, itchy eyes OR Zaditor one drop in each eye twice a day as needed for red itchy eyes.  Stinging insects Avoid stinging insects. In case of an allergic reaction, take Benadryl 50 mg every 4 hours, and if life-threatening symptoms occur, inject with EpiPen 0.3 mg.  Call the clinic if this treatment plan is not working well for you  Follow up in 6 months or sooner if needed.  Reducing Pollen Exposure The American Academy of Allergy, Asthma and Immunology suggests the following steps to reduce your exposure to pollen during allergy seasons. 1. Do not hang sheets or clothing out to dry; pollen may collect on these items. 2. Do not mow lawns or spend time around freshly cut grass; mowing stirs up pollen. 3. Keep windows closed at night.  Keep car windows closed while driving. 4. Minimize morning activities outdoors, a time when pollen counts are usually at their highest. 5. Stay indoors as much as possible when pollen counts or humidity is high and on windy days when pollen tends to remain in the air longer. 6. Use air conditioning  when possible.  Many air conditioners have filters that trap the pollen spores. 7. Use a HEPA room air filter to remove pollen form the indoor air you breathe.   Control of Dust Mite Allergen Dust mites play a major role in allergic asthma and rhinitis. They occur in environments with high humidity wherever human skin is found. Dust mites absorb humidity from the atmosphere (ie, they do not drink) and feed on organic matter (including shed human and animal skin). Dust mites are a microscopic type of insect that you cannot see with the naked eye. High levels of dust mites have been detected from mattresses, pillows, carpets, upholstered furniture, bed covers, clothes, soft toys and any woven material. The principal allergen of the dust mite is found in its feces. A gram of dust may contain 1,000 mites and 250,000 fecal particles. Mite antigen is easily measured in the air during house cleaning activities. Dust mites do not bite and do not cause harm to humans, other than by triggering allergies/asthma.  Ways to decrease your exposure to dust mites in your home:  1. Encase mattresses, box springs and pillows with a mite-impermeable barrier or cover  2. Wash sheets, blankets and drapes weekly in hot water (130 F) with detergent and dry them in a dryer on the hot setting.  3. Have the room cleaned frequently with a vacuum cleaner and a damp dust-mop. For carpeting or rugs, vacuuming with a vacuum cleaner equipped with a high-efficiency particulate air (HEPA) filter. The dust  mite allergic individual should not be in a room which is being cleaned and should wait 1 hour after cleaning before going into the room.  4. Do not sleep on upholstered furniture (eg, couches).  5. If possible removing carpeting, upholstered furniture and drapery from the home is ideal. Horizontal blinds should be eliminated in the rooms where the person spends the most time (bedroom, study, television room). Washable vinyl,  roller-type shades are optimal.  6. Remove all non-washable stuffed toys from the bedroom. Wash stuffed toys weekly like sheets and blankets above.  7. Reduce indoor humidity to less than 50%. Inexpensive humidity monitors can be purchased at most hardware stores. Do not use a humidifier as can make the problem worse and are not recommended.  Control of Dog or Cat Allergen Avoidance is the best way to manage a dog or cat allergy. If you have a dog or cat and are allergic to dog or cats, consider removing the dog or cat from the home. If you have a dog or cat but don't want to find it a new home, or if your family wants a pet even though someone in the household is allergic, here are some strategies that may help keep symptoms at bay:  8. Keep the pet out of your bedroom and restrict it to only a few rooms. Be advised that keeping the dog or cat in only one room will not limit the allergens to that room. 9. Don't pet, hug or kiss the dog or cat; if you do, wash your hands with soap and water. 10. High-efficiency particulate air (HEPA) cleaners run continuously in a bedroom or living room can reduce allergen levels over time. 11. Regular use of a high-efficiency vacuum cleaner or a central vacuum can reduce allergen levels. 12. Giving your dog or cat a bath at least once a week can reduce airborne allergen.

## 2019-12-08 NOTE — Progress Notes (Signed)
[place] [address] Dept: [phone]  FOLLOW UP NOTE  Patient ID: Vanessa Goodman, female    DOB: [birth year]  Age: 54 y.o. MRN: [id number] Date of Office Visit: 12/09/2019  Assessment  Chief Complaint: Asthma  HPI Vanessa Goodman is a 54 year old female who presents to the clinic for follow-up visit.  She was last seen in this clinic on 06/01/2019 for evaluation of asthma, allergic rhinitis, allergic conjunctivitis, allergy to stinging insect, and anaphylactic reaction to unknown source.  In the interim, about 2 weeks ago, she reports that her Advair 100 was accidentally discarded.  At today's visit she reports that, over the last 2 weeks that she has been without her Advair, she has experienced symptoms including shortness of breath which is worse with activity, wheezing occurring on one night, and a dry cough.  She continues montelukast 10 mg once a day and has used her albuterol 2 times in the 2-week intervals that she has gone without her Advair.  Allergic rhinitis is reported as moderately well controlled with mild nasal congestion and sneezing for which she takes cetirizine 10 mg once a day and uses Flonase and saline nasal spray as needed.  Allergic conjunctivitis is reported as well controlled with symptoms including red itchy eyes occurring occasionally for which she uses over-the-counter eyedrops with relief of symptoms.  She continues to avoid stinging insects with no incidences since her last visit to this clinic.  She denies anaphylactic reaction of unknown source since her last visit to this clinic.  Her current medications are listed in the chart.   Drug Allergies:  Allergies  Allergen Reactions  . Bee Venom Hives and Rash    Physical Exam: BP 90/80   Pulse 70   Temp (!) 97.3 F (36.3 C) (Tympanic)   Resp 16   Ht 5' 6.5" (1.689 m)   Wt 156 lb (70.8 kg)   SpO2 96%   BMI 24.80 kg/m    Physical Exam Vitals reviewed.  Constitutional:      Appearance:  Normal appearance.  HENT:     Head: Normocephalic and atraumatic.     Right Ear: Tympanic membrane normal.     Left Ear: Tympanic membrane normal.     Nose:     Comments: Bilateral nares slightly erythematous with no nasal drainage noted.  Pharynx normal.  Ears normal.  Eyes normal.    Mouth/Throat:     Pharynx: Oropharynx is clear.  Eyes:     Conjunctiva/sclera: Conjunctivae normal.  Cardiovascular:     Rate and Rhythm: Normal rate and regular rhythm.     Heart sounds: Normal heart sounds. No murmur heard.   Pulmonary:     Effort: Pulmonary effort is normal.     Breath sounds: Normal breath sounds.     Comments: Lungs clear to auscultation Musculoskeletal:        General: Normal range of motion.     Cervical back: Normal range of motion and neck supple.  Skin:    General: Skin is warm and dry.  Neurological:     Mental Status: She is alert and oriented to person, place, and time.  Psychiatric:        Mood and Affect: Mood normal.        Behavior: Behavior normal.        Thought Content: Thought content normal.        Judgment: Judgment normal.    Diagnostics: FVC 3.38, FEV1 2.80.  Predicted FVC 3.87, predicted  FEV1 3.04.  Spirometry indicates normal ventilatory function.  Assessment and Plan: 1. Mild persistent asthma without complication   2. Seasonal allergic rhinitis due to pollen   3. Seasonal allergic conjunctivitis   4. Toxic effect of venom of bees, unintentional, subsequent encounter   5. History of breast cancer     Meds ordered this encounter  Medications  . Fluticasone-Salmeterol (ADVAIR DISKUS) 100-50 MCG/DOSE AEPB    Sig: INHALE 1 PUFF EVERY 12 HOURS TO PREVENT COUGHING OR WHEEZING    Dispense:  60 each    Refill:  5  . EPINEPHrine 0.3 mg/0.3 mL IJ SOAJ injection    Sig: For life-threatening symptoms inject.    Dispense:  1 each    Refill:  2    Patient Instructions  Asthma Continue montelukast 10 mg-take 1 tablet once a day to prevent coughing  or wheezing Continue Advair Diskus 100-50. Take 1 puff once a day to prevent coughing or wheezing Continue albuterol 2 puffs every 4 hours if needed for wheezing or coughing spells.   You may use albuterol 2 puffs 5 to 15 minutes before exercise  Allergic rhinitis Continue cetirizine 10 mg-take 1 tablet once a day as needed for runny nose or itchy eyes Continue Flonase 2 sprays per nostril once a day if needed for stuffy nose Consider saline nasal rinses as needed for nasal symptoms. Use this before any medicated nasal sprays for best result Continue allergen avoidance measures directed toward, grass pollen, tree pollen, weed pollen, cat, and dust mite as listed below  Allergic conjunctivitis Some over the counter eye drops include Pataday one drop in each eye once a day as needed for red, itchy eyes OR Zaditor one drop in each eye twice a day as needed for red itchy eyes.  Stinging insects Avoid stinging insects. In case of an allergic reaction, take Benadryl 50 mg every 4 hours, and if life-threatening symptoms occur, inject with EpiPen 0.3 mg.  Call the clinic if this treatment plan is not working well for you  Follow up in 6 months or sooner if needed.  Return in about 6 months (around 06/08/2020), or if symptoms worsen or fail to improve.    Thank you for the opportunity to care for this patient.  Please do not hesitate to contact me with questions.  Gareth Morgan, FNP [facility]

## 2019-12-09 ENCOUNTER — Ambulatory Visit: Payer: BC Managed Care – PPO | Admitting: Family Medicine

## 2019-12-09 ENCOUNTER — Encounter: Payer: Self-pay | Admitting: Family Medicine

## 2019-12-09 ENCOUNTER — Other Ambulatory Visit: Payer: Self-pay

## 2019-12-09 VITALS — BP 90/80 | HR 70 | Temp 97.3°F | Resp 16 | Ht 66.5 in | Wt 156.0 lb

## 2019-12-09 DIAGNOSIS — J4530 Mild persistent asthma, uncomplicated: Secondary | ICD-10-CM | POA: Diagnosis not present

## 2019-12-09 DIAGNOSIS — Z853 Personal history of malignant neoplasm of breast: Secondary | ICD-10-CM

## 2019-12-09 DIAGNOSIS — H1010 Acute atopic conjunctivitis, unspecified eye: Secondary | ICD-10-CM

## 2019-12-09 DIAGNOSIS — J301 Allergic rhinitis due to pollen: Secondary | ICD-10-CM

## 2019-12-09 DIAGNOSIS — T63441D Toxic effect of venom of bees, accidental (unintentional), subsequent encounter: Secondary | ICD-10-CM

## 2019-12-09 DIAGNOSIS — Z23 Encounter for immunization: Secondary | ICD-10-CM | POA: Diagnosis not present

## 2019-12-09 MED ORDER — FLUTICASONE-SALMETEROL 100-50 MCG/DOSE IN AEPB: INHALATION_SPRAY | RESPIRATORY_TRACT | 5 refills | Status: DC

## 2019-12-09 MED ORDER — EPINEPHRINE 0.3 MG/0.3ML IJ SOAJ: INTRAMUSCULAR | 2 refills | Status: DC

## 2020-04-28 DIAGNOSIS — Z6825 Body mass index (BMI) 25.0-25.9, adult: Secondary | ICD-10-CM | POA: Diagnosis not present

## 2020-04-28 DIAGNOSIS — Z01419 Encounter for gynecological examination (general) (routine) without abnormal findings: Secondary | ICD-10-CM | POA: Diagnosis not present

## 2020-05-04 DIAGNOSIS — D485 Neoplasm of uncertain behavior of skin: Secondary | ICD-10-CM | POA: Diagnosis not present

## 2020-05-04 DIAGNOSIS — D22121 Melanocytic nevi of left upper eyelid, including canthus: Secondary | ICD-10-CM | POA: Diagnosis not present

## 2020-05-04 DIAGNOSIS — D225 Melanocytic nevi of trunk: Secondary | ICD-10-CM | POA: Diagnosis not present

## 2020-05-04 DIAGNOSIS — D2239 Melanocytic nevi of other parts of face: Secondary | ICD-10-CM | POA: Diagnosis not present

## 2020-05-04 DIAGNOSIS — D220 Melanocytic nevi of lip: Secondary | ICD-10-CM | POA: Diagnosis not present

## 2020-05-04 DIAGNOSIS — L570 Actinic keratosis: Secondary | ICD-10-CM | POA: Diagnosis not present

## 2020-05-04 DIAGNOSIS — D2261 Melanocytic nevi of right upper limb, including shoulder: Secondary | ICD-10-CM | POA: Diagnosis not present

## 2020-05-30 DIAGNOSIS — L988 Other specified disorders of the skin and subcutaneous tissue: Secondary | ICD-10-CM | POA: Diagnosis not present

## 2020-05-30 DIAGNOSIS — D485 Neoplasm of uncertain behavior of skin: Secondary | ICD-10-CM | POA: Diagnosis not present

## 2020-06-08 ENCOUNTER — Other Ambulatory Visit: Payer: Self-pay | Admitting: Family Medicine

## 2020-06-08 DIAGNOSIS — J4530 Mild persistent asthma, uncomplicated: Secondary | ICD-10-CM

## 2020-07-03 ENCOUNTER — Other Ambulatory Visit: Payer: Self-pay | Admitting: Family Medicine

## 2020-07-04 ENCOUNTER — Other Ambulatory Visit: Payer: Self-pay | Admitting: Family Medicine

## 2020-08-04 ENCOUNTER — Other Ambulatory Visit: Payer: Self-pay | Admitting: Family Medicine

## 2020-08-04 DIAGNOSIS — J4530 Mild persistent asthma, uncomplicated: Secondary | ICD-10-CM

## 2020-08-08 ENCOUNTER — Other Ambulatory Visit: Payer: Self-pay | Admitting: Family Medicine

## 2020-08-08 ENCOUNTER — Telehealth: Payer: Self-pay | Admitting: Family Medicine

## 2020-08-08 ENCOUNTER — Other Ambulatory Visit: Payer: Self-pay

## 2020-08-08 MED ORDER — FLUTICASONE-SALMETEROL 100-50 MCG/ACT IN AEPB: 1.0000 | INHALATION_SPRAY | Freq: Two times a day (BID) | RESPIRATORY_TRACT | 0 refills | Status: DC

## 2020-08-08 MED ORDER — MONTELUKAST SODIUM 10 MG PO TABS: ORAL_TABLET | ORAL | 0 refills | Status: DC

## 2020-08-08 NOTE — Telephone Encounter (Signed)
Sent in refills of advair and montelukast

## 2020-08-08 NOTE — Telephone Encounter (Signed)
Pt request refills for montelukast and Advair. Pt is leaving out of town in the morning and asks if these can be filled this evening. Pt has appt schedule 6/16.

## 2020-08-17 NOTE — Progress Notes (Signed)
[place] [address] Dept: [phone]  FOLLOW UP NOTE  Patient ID: [name], female    DOB: 08/30/1965  Age: 55 y.o. MRN: [id number] Date of Office Visit: 08/18/2020  Assessment  Chief Complaint: Asthma (States no issued with her asthma) and Allergic Rhinitis  (States that they are ok.)  HPI Vanessa Goodman is a 55 year old female who presents to the clinic for follow-up visit.  She was last seen in this clinic on 12/09/2019 for evaluation of asthma, allergic rhinitis, allergic conjunctivitis, allergy to insect stings, and idiopathic anaphylaxis.  At today's visit, she reports her asthma has been well controlled with no shortness of breath or wheeze with activity or rest.  She does report occasional cough producing mucus which she describes as " just clearing mucus".  She continues montelukast 10 mg once a day and Advair 100-1 puff once a day.  She reports she has not needed her albuterol since her last visit to this clinic.  Allergic rhinitis is reported as moderately well controlled with symptoms including infrequent rhinorrhea, occasional nasal congestion, sneezing in the spring, and postnasal drainage.  She continues cetirizine 10 mg once a day and uses Flonase nasal spray during the spring season.  She is not currently using saline nasal rinses.  Allergic conjunctivitis is reported as moderately well controlled with symptoms mostly occurring in the spring season including red and itchy eyes for which she has used Systane and sunglasses with relief of symptoms.  She reports she has not had an incident with the stinging insect since her last visit to this clinic.  She has not had an idiopathic anaphylactic reaction or needed her EpiPen since her last visit to this clinic.  Her current medications are listed in the chart.   Drug Allergies:  Allergies  Allergen Reactions   Bee Venom Hives and Rash    Physical Exam: BP 92/60   Pulse 66   Temp 98.3 F (36.8 C)    Resp 14   Ht 5\' 6"  (1.676 m)   Wt 161 lb (73 kg)   SpO2 98%   BMI 25.99 kg/m    Physical Exam Vitals reviewed.  Constitutional:      Appearance: Normal appearance.  HENT:     Head: Normocephalic and atraumatic.     Right Ear: Tympanic membrane normal.     Left Ear: Tympanic membrane normal.     Nose:     Comments: Bilateral nares slightly erythematous with clear nasal drainage noted.  Pharynx normal.  Ears normal.  Eyes normal.    Mouth/Throat:     Pharynx: Oropharynx is clear.  Eyes:     Conjunctiva/sclera: Conjunctivae normal.  Cardiovascular:     Rate and Rhythm: Normal rate and regular rhythm.     Heart sounds: Normal heart sounds. No murmur heard. Pulmonary:     Effort: Pulmonary effort is normal.     Breath sounds: Normal breath sounds.     Comments: Lungs clear to auscultation Musculoskeletal:     Cervical back: Normal range of motion and neck supple.  Neurological:     Mental Status: She is alert.    Diagnostics: FVC 3.90, FEV1 3.26.  Predicted FVC 3.74, predicted FEV1 2.94.  Spirometry indicates normal ventilatory function.  Assessment and Plan: 1. Mild persistent asthma without complication   2. Seasonal allergic rhinitis due to pollen   3. Seasonal allergic conjunctivitis   4. Toxic effect of venom of bees, unintentional, subsequent encounter  Meds ordered this encounter  Medications   montelukast (SINGULAIR) 10 MG tablet    Sig: TAKE 1 TABLET BY MOUTH DAILY TO PREVENT COUGHING OR WHEEZING    Dispense:  90 tablet    Refill:  1    **Patient requests 90 days supply**   albuterol (PROAIR HFA) 108 (90 Base) MCG/ACT inhaler    Sig: 2 puffs every 4 hours if needed for wheezing or coughing spells.    Dispense:  1 each    Refill:  3   fluticasone (FLONASE) 50 MCG/ACT nasal spray    Sig: 2 sprays per nostril once a day if needed for stuffy nose.    Dispense:  16 g    Refill:  5   fluticasone-salmeterol (ADVAIR) 100-50 MCG/ACT AEPB    Sig: Inhale 1 puff  into the lungs 2 (two) times daily.    Dispense:  60 each    Refill:  5   Olopatadine HCl (PATADAY) 0.2 % SOLN    Sig: Place 1 drop into both eyes 1 day or 1 dose.    Dispense:  2.5 mL    Refill:  5   EPINEPHrine (AUVI-Q) 0.3 mg/0.3 mL IJ SOAJ injection    Sig: Inject 0.3 mg into the muscle once for 1 dose. As directed for life-threatening allergic reactions    Dispense:  2 each    Refill:  1    Patient Instructions  Asthma Continue montelukast 10 mg-take 1 tablet once a day to prevent coughing or wheezing Continue Advair Diskus 100-50. Take 1 puff once a day to prevent coughing or wheezing Continue albuterol 2 puffs every 4 hours if needed for wheezing or coughing spells.   You may use albuterol 2 puffs 5 to 15 minutes before exercise to decrease cough or wheeze  Allergic rhinitis Continue cetirizine 10 mg-take 1 tablet once a day as needed for runny nose or itchy eyes Continue Flonase 2 sprays per nostril once a day if needed for stuffy nose Consider saline nasal rinses as needed for nasal symptoms. Use this before any medicated nasal sprays for best result Continue allergen avoidance measures directed toward, grass pollen, tree pollen, weed pollen, cat, and dust mite as listed below  Allergic conjunctivitis Some over the counter eye drops include Pataday one drop in each eye once a day as needed for red, itchy eyes OR Zaditor one drop in each eye twice a day as needed for red itchy eyes.  Stinging insects Avoid stinging insects. In case of an allergic reaction, take Benadryl 50 mg every 4 hours, and if life-threatening symptoms occur, inject with AuviQ 0.3 mg.  Call the clinic if this treatment plan is not working well for you  Follow up in 6 months or sooner if needed.   Return in about 6 months (around 02/17/2021), or if symptoms worsen or fail to improve.    Thank you for the opportunity to care for this patient.  Please do not hesitate to contact me with  questions.  Gareth Morgan, FNP [facility]

## 2020-08-17 NOTE — Patient Instructions (Addendum)
Asthma Continue montelukast 10 mg-take 1 tablet once a day to prevent coughing or wheezing Continue Advair Diskus 100-50. Take 1 puff once a day to prevent coughing or wheezing Continue albuterol 2 puffs every 4 hours if needed for wheezing or coughing spells.   You may use albuterol 2 puffs 5 to 15 minutes before exercise to decrease cough or wheeze  Allergic rhinitis Continue cetirizine 10 mg-take 1 tablet once a day as needed for runny nose or itchy eyes Continue Flonase 2 sprays per nostril once a day if needed for stuffy nose Consider saline nasal rinses as needed for nasal symptoms. Use this before any medicated nasal sprays for best result Continue allergen avoidance measures directed toward, grass pollen, tree pollen, weed pollen, cat, and dust mite as listed below  Allergic conjunctivitis Some over the counter eye drops include Pataday one drop in each eye once a day as needed for red, itchy eyes OR Zaditor one drop in each eye twice a day as needed for red itchy eyes.  Stinging insects Avoid stinging insects. In case of an allergic reaction, take Benadryl 50 mg every 4 hours, and if life-threatening symptoms occur, inject with AuviQ 0.3 mg.  Call the clinic if this treatment plan is not working well for you  Follow up in 6 months or sooner if needed.  Reducing Pollen Exposure The American Academy of Allergy, Asthma and Immunology suggests the following steps to reduce your exposure to pollen during allergy seasons. Do not hang sheets or clothing out to dry; pollen may collect on these items. Do not mow lawns or spend time around freshly cut grass; mowing stirs up pollen. Keep windows closed at night.  Keep car windows closed while driving. Minimize morning activities outdoors, a time when pollen counts are usually at their highest. Stay indoors as much as possible when pollen counts or humidity is high and on windy days when pollen tends to remain in the air longer. Use air  conditioning when possible.  Many air conditioners have filters that trap the pollen spores. Use a HEPA room air filter to remove pollen form the indoor air you breathe.   Control of Dust Mite Allergen Dust mites play a major role in allergic asthma and rhinitis. They occur in environments with high humidity wherever human skin is found. Dust mites absorb humidity from the atmosphere (ie, they do not drink) and feed on organic matter (including shed human and animal skin). Dust mites are a microscopic type of insect that you cannot see with the naked eye. High levels of dust mites have been detected from mattresses, pillows, carpets, upholstered furniture, bed covers, clothes, soft toys and any woven material. The principal allergen of the dust mite is found in its feces. A gram of dust may contain 1,000 mites and 250,000 fecal particles. Mite antigen is easily measured in the air during house cleaning activities. Dust mites do not bite and do not cause harm to humans, other than by triggering allergies/asthma.  Ways to decrease your exposure to dust mites in your home:  1. Encase mattresses, box springs and pillows with a mite-impermeable barrier or cover  2. Wash sheets, blankets and drapes weekly in hot water (130 F) with detergent and dry them in a dryer on the hot setting.  3. Have the room cleaned frequently with a vacuum cleaner and a damp dust-mop. For carpeting or rugs, vacuuming with a vacuum cleaner equipped with a high-efficiency particulate air (HEPA) filter. The dust mite allergic  individual should not be in a room which is being cleaned and should wait 1 hour after cleaning before going into the room.  4. Do not sleep on upholstered furniture (eg, couches).  5. If possible removing carpeting, upholstered furniture and drapery from the home is ideal. Horizontal blinds should be eliminated in the rooms where the person spends the most time (bedroom, study, television room). Washable  vinyl, roller-type shades are optimal.  6. Remove all non-washable stuffed toys from the bedroom. Wash stuffed toys weekly like sheets and blankets above.  7. Reduce indoor humidity to less than 50%. Inexpensive humidity monitors can be purchased at most hardware stores. Do not use a humidifier as can make the problem worse and are not recommended.  Control of Dog or Cat Allergen Avoidance is the best way to manage a dog or cat allergy. If you have a dog or cat and are allergic to dog or cats, consider removing the dog or cat from the home. If you have a dog or cat but don't want to find it a new home, or if your family wants a pet even though someone in the household is allergic, here are some strategies that may help keep symptoms at bay:  Keep the pet out of your bedroom and restrict it to only a few rooms. Be advised that keeping the dog or cat in only one room will not limit the allergens to that room. Don't pet, hug or kiss the dog or cat; if you do, wash your hands with soap and water. High-efficiency particulate air (HEPA) cleaners run continuously in a bedroom or living room can reduce allergen levels over time. Regular use of a high-efficiency vacuum cleaner or a central vacuum can reduce allergen levels. Giving your dog or cat a bath at least once a week can reduce airborne allergen.

## 2020-08-18 ENCOUNTER — Encounter: Payer: Self-pay | Admitting: Family Medicine

## 2020-08-18 ENCOUNTER — Ambulatory Visit: Payer: BC Managed Care – PPO | Admitting: Family Medicine

## 2020-08-18 ENCOUNTER — Other Ambulatory Visit: Payer: Self-pay

## 2020-08-18 VITALS — BP 92/60 | HR 66 | Temp 98.3°F | Resp 14 | Ht 66.0 in | Wt 161.0 lb

## 2020-08-18 DIAGNOSIS — H1013 Acute atopic conjunctivitis, bilateral: Secondary | ICD-10-CM

## 2020-08-18 DIAGNOSIS — J301 Allergic rhinitis due to pollen: Secondary | ICD-10-CM | POA: Diagnosis not present

## 2020-08-18 DIAGNOSIS — H1010 Acute atopic conjunctivitis, unspecified eye: Secondary | ICD-10-CM

## 2020-08-18 DIAGNOSIS — J4530 Mild persistent asthma, uncomplicated: Secondary | ICD-10-CM | POA: Diagnosis not present

## 2020-08-18 DIAGNOSIS — T63441D Toxic effect of venom of bees, accidental (unintentional), subsequent encounter: Secondary | ICD-10-CM

## 2020-08-18 MED ORDER — FLUTICASONE-SALMETEROL 100-50 MCG/ACT IN AEPB: 1.0000 | INHALATION_SPRAY | Freq: Two times a day (BID) | RESPIRATORY_TRACT | 5 refills | Status: DC

## 2020-08-18 MED ORDER — ALBUTEROL SULFATE HFA 108 (90 BASE) MCG/ACT IN AERS: INHALATION_SPRAY | RESPIRATORY_TRACT | 3 refills | Status: AC

## 2020-08-18 MED ORDER — OLOPATADINE HCL 0.2 % OP SOLN: 1.0000 [drp] | OPHTHALMIC | 5 refills | Status: DC

## 2020-08-18 MED ORDER — EPINEPHRINE 0.3 MG/0.3ML IJ SOAJ: 0.3000 mg | Freq: Once | INTRAMUSCULAR | 1 refills | Status: AC

## 2020-08-18 MED ORDER — FLUTICASONE PROPIONATE 50 MCG/ACT NA SUSP: NASAL | 5 refills | Status: DC

## 2020-08-18 MED ORDER — MONTELUKAST SODIUM 10 MG PO TABS: ORAL_TABLET | ORAL | 1 refills | Status: DC

## 2020-11-28 DIAGNOSIS — J019 Acute sinusitis, unspecified: Secondary | ICD-10-CM | POA: Diagnosis not present

## 2020-11-28 DIAGNOSIS — J209 Acute bronchitis, unspecified: Secondary | ICD-10-CM | POA: Diagnosis not present

## 2020-11-28 DIAGNOSIS — Z20828 Contact with and (suspected) exposure to other viral communicable diseases: Secondary | ICD-10-CM | POA: Diagnosis not present

## 2020-11-28 DIAGNOSIS — S39012A Strain of muscle, fascia and tendon of lower back, initial encounter: Secondary | ICD-10-CM | POA: Diagnosis not present

## 2020-12-05 ENCOUNTER — Other Ambulatory Visit: Payer: Self-pay | Admitting: Sports Medicine

## 2020-12-05 ENCOUNTER — Ambulatory Visit
Admission: RE | Admit: 2020-12-05 | Discharge: 2020-12-05 | Disposition: A | Discharge status: Home / Self Care | Payer: Self-pay | Source: Ambulatory Visit | Attending: Sports Medicine | Admitting: Sports Medicine

## 2020-12-05 DIAGNOSIS — M5489 Other dorsalgia: Secondary | ICD-10-CM

## 2020-12-05 DIAGNOSIS — M545 Low back pain, unspecified: Secondary | ICD-10-CM | POA: Diagnosis not present

## 2021-01-19 DIAGNOSIS — J069 Acute upper respiratory infection, unspecified: Secondary | ICD-10-CM | POA: Diagnosis not present

## 2021-01-19 DIAGNOSIS — J101 Influenza due to other identified influenza virus with other respiratory manifestations: Secondary | ICD-10-CM | POA: Diagnosis not present

## 2021-03-08 DIAGNOSIS — Z20828 Contact with and (suspected) exposure to other viral communicable diseases: Secondary | ICD-10-CM | POA: Diagnosis not present

## 2021-03-08 DIAGNOSIS — Z03818 Encounter for observation for suspected exposure to other biological agents ruled out: Secondary | ICD-10-CM | POA: Diagnosis not present

## 2021-03-23 DIAGNOSIS — D2239 Melanocytic nevi of other parts of face: Secondary | ICD-10-CM | POA: Diagnosis not present

## 2021-03-23 DIAGNOSIS — D2261 Melanocytic nevi of right upper limb, including shoulder: Secondary | ICD-10-CM | POA: Diagnosis not present

## 2021-03-23 DIAGNOSIS — D225 Melanocytic nevi of trunk: Secondary | ICD-10-CM | POA: Diagnosis not present

## 2021-03-23 DIAGNOSIS — D2262 Melanocytic nevi of left upper limb, including shoulder: Secondary | ICD-10-CM | POA: Diagnosis not present

## 2021-03-23 DIAGNOSIS — D220 Melanocytic nevi of lip: Secondary | ICD-10-CM | POA: Diagnosis not present

## 2021-03-23 DIAGNOSIS — L570 Actinic keratosis: Secondary | ICD-10-CM | POA: Diagnosis not present

## 2021-03-23 DIAGNOSIS — D485 Neoplasm of uncertain behavior of skin: Secondary | ICD-10-CM | POA: Diagnosis not present

## 2021-04-06 DIAGNOSIS — J3089 Other allergic rhinitis: Secondary | ICD-10-CM | POA: Diagnosis not present

## 2021-04-06 DIAGNOSIS — Z0000 Encounter for general adult medical examination without abnormal findings: Secondary | ICD-10-CM | POA: Diagnosis not present

## 2021-04-06 DIAGNOSIS — R140 Abdominal distension (gaseous): Secondary | ICD-10-CM | POA: Diagnosis not present

## 2021-04-06 DIAGNOSIS — Z853 Personal history of malignant neoplasm of breast: Secondary | ICD-10-CM | POA: Diagnosis not present

## 2021-04-06 DIAGNOSIS — J4530 Mild persistent asthma, uncomplicated: Secondary | ICD-10-CM | POA: Diagnosis not present

## 2021-05-08 ENCOUNTER — Telehealth: Payer: Self-pay | Admitting: Genetic Counselor

## 2021-05-08 NOTE — Telephone Encounter (Signed)
Scheduled appt per 3/6 referral. Pt is aware of appt date and time. Pt is aware to arrive 15 mins prior to appt time and to bring and updated insurance card. Pt is aware of appt location.   

## 2021-06-05 NOTE — Progress Notes (Signed)
REFERRING PROVIDER: ?Lavone Orn, MD ?[address] ?[address] ?[place],  [place] [address] ? ?PRIMARY PROVIDER:  ?Lavone Orn, MD ? ?PRIMARY REASON FOR VISIT:  ?Encounter Diagnoses  ?Name Primary?  ? History of breast cancer Yes  ? Family history of breast cancer   ? Family history of pancreatic cancer   ? ?HISTORY OF PRESENT ILLNESS:   ?Ms. Vanessa Goodman, a 56 y.o. female, was seen for a [facility] cancer genetics consultation at the request of Dr. Laurann Montana due to a personal and family history of cancer.  Ms. Mahurin presents to clinic today to discuss the possibility of a hereditary predisposition to cancer, to discuss genetic testing, and to further clarify her future cancer risks, as well as potential cancer risks for family members.  ? ?CANCER HISTORY:  ?Ms. 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  ? ?Ms. Gunnoe first had genetic testing through [facility] for the BRCA1/2 genes. In 2015, she had updated genetic testing through [facility] for [employer] Panel (17 genes). In 2019, she had updated genetic testing through [facility] for 29 genes.  ? ?RISK FACTORS:  ?First live birth at age 74.  ?OCP use for approximately 9 years.  ?Ovaries intact: yes.  ?Uterus intact: yes.  ?Menopausal status: perimenopausal.  ?HRT use: 0 years. ?Colonoscopy: yes; normal. ?Any excessive radiation exposure in the past: no ? ?Past Medical History:  ?Diagnosis Date  ? Asthma   ? Recurrent upper respiratory infection (URI)   ? ? ?No past surgical history on file. ? ?Social History  ? ?Socioeconomic History  ? Marital status: Married  ?  Spouse name: Not on file  ? Number of children: Not on file  ? Years of education: Not on file  ? Highest education level: Not on file  ?Occupational History  ? Not on file  ?Tobacco Use  ? Smoking status: Never  ? Smokeless tobacco: Never  ?Substance and Sexual Activity  ? Alcohol use:  Yes  ? Drug use: Never  ? Sexual activity: Not on file  ?Other Topics Concern  ? Not on file  ?Social History Narrative  ? Not on file  ? ?Social Determinants of Health  ? ?Financial Resource Strain: Not on file  ?Food Insecurity: Not on file  ?Transportation Needs: Not on file  ?Physical Activity: Not on file  ?Stress: Not on file  ?Social Connections: Not on file  ?  ? ?FAMILY HISTORY:  ?We obtained a detailed, 4-generation family history.  Significant diagnoses are listed below: ?Family History  ?Problem Relation Age of Onset  ? Breast cancer Mother 78  ? Asthma Father   ? Allergic rhinitis Father   ? Pancreatic cancer Father 33  ? Breast cancer Sister 10  ? Pancreatic cancer Paternal Aunt 73  ? Asthma Maternal Grandmother   ? Lung cancer Paternal Grandfather   ? Breast cancer Cousin   ?     maternal first cousin, dx. 43s  ? Eczema Neg Hx   ? Urticaria Neg Hx   ? ? ? ? ?Ms. Grill's sister was diagnosed with lobular breast cancer at age 73. Her mother was diagnosed with breast cancer at age 84 and had a unilateral mastectomy, she died at age 9. Ms. Lords has a maternal cousin who was diagnosed with breast cancer in her 60s and a maternal great aunt (grandfather's sister) who was diagnosed with  an unknown cancer, she is deceased. ? ?Ms. Elliot's father recently passed away due to pancreatic cancer at the age of 32, he was diagnosed at age 44. Her paternal aunt (her father's only sibling) also died due to pancreatic cancer, she was diagnosed at age 74. Her paternal grandfather was diagnosed with lung cancer, he smoked and died in his 24s. ? ?There is no reported Ashkenazi Jewish ancestry. ? ?GENETIC COUNSELING ASSESSMENT: Ms. Lahman is a 56 y.o. female with a personal and family history of cancer which is somewhat suggestive of a hereditary predisposition to cancer. We, therefore, discussed and recommended the following at today's visit.  ? ?DISCUSSION: We discussed that 5 - 10% of cancer is hereditary, with  most cases of breast associated with BRCA1/2.  There are other genes that can be associated with hereditary breast and pancreatic cancer syndromes.  We discussed that testing is beneficial for several reasons including knowing how to follow individuals after completing their treatment and understanding if other family members could be at an increased risk for cancer.  ? ?We reviewed the characteristics, features and inheritance patterns of hereditary cancer syndromes. We also discussed genetic testing, including the appropriate family members to test, the process of testing, insurance coverage and turn-around-time for results. We discussed the implications of a negative, positive, carrier and/or variant of uncertain significant result. We recommended Ms. Hampe pursue genetic testing for a panel that includes genes associated with breast and pancreatic cancer.  ? ?Ms. Kolden was offered a common hereditary cancer panel (48 genes) and an expanded pan-cancer panel (77 genes). Ms. Taha was informed of the benefits and limitations of each panel, including that expanded pan-cancer panels contain genes that do not have clear management guidelines at this point in time.  We also discussed that as the number of genes included on a panel increases, the chances of variants of uncertain significance increases.  After considering the benefits and limitations of each gene panel, Ms. Hittle elected to have [facility] CustomNext Panel. ? ?The CustomNext-Cancer+RNAinsight panel offered by [facility] includes sequencing and rearrangement analysis for the following 48 genes:  APC, ATM, AXIN2, BARD1, BMPR1A, BRCA1, BRCA2, BRIP1, CDH1, CDK4, CDKN2A, CHEK2, CTNNA1, DICER1, EGFR, EPCAM, GREM1, HOXB13, KIT, MEN1, MLH1, MSH2, MSH3, MSH6, MUTYH, NBN, NF1, NTHL1, PALB2, PDGFRA, PMS2, POLD1, POLE, PTEN, RAD50, RAD51C, RAD51D, SDHA, SDHB, SDHC, SDHD, SMAD4, SMARCA4, STK11, TP53, TSC1, TSC2, and VHL.  RNA data is routinely analyzed for use  in variant interpretation for all genes. ? ?Based on Ms. Belflower's personal and family history of cancer, she meets medical criteria for genetic testing. Despite that she meets criteria, she may still have an out of pocket cost. We discussed that if her out of pocket cost for testing is over $100, the laboratory will call and confirm whether she wants to proceed with testing.  If the out of pocket cost of testing is less than $100 she will be billed by the genetic testing laboratory.  ? ?PLAN: After considering the risks, benefits, and limitations, Ms. Nishi provided informed consent to pursue genetic testing and the blood sample was sent to [facility] for analysis of the CustomNext Panel (48 genes). Results should be available within approximately 2-3 weeks' time, at which point they will be disclosed by telephone to Ms. Killingsworth, as will any additional recommendations warranted by these results. Ms. Huntoon will receive a summary of her genetic counseling visit and a copy of her results once available. This information will also be available in  Epic.  ? ?Ms. Perris's questions were answered to her satisfaction today. Our contact information was provided should additional questions or concerns arise. Thank you for the referral and allowing [place] to share in the care of your patient.  ? ?Lucille Passy, MS, [place] ?Genetic Counselor ?[email or web address] ?(P) [phone] ? ?The patient was seen for a total of 40 minutes in face-to-face genetic counseling. The patient was seen alone.  Drs. Lindi Adie and/or Burr Medico were available to discuss this case as needed.  ? ?_______________________________________________________________________ ?For Office Staff:  ?Number of people involved in session: 2 ?Was an Intern/ student involved with case: no ? ?

## 2021-06-06 ENCOUNTER — Other Ambulatory Visit: Payer: Self-pay

## 2021-06-06 ENCOUNTER — Inpatient Hospital Stay: Payer: BC Managed Care – PPO

## 2021-06-06 ENCOUNTER — Inpatient Hospital Stay: Payer: BC Managed Care – PPO | Attending: Genetic Counselor | Admitting: Genetic Counselor

## 2021-06-06 ENCOUNTER — Other Ambulatory Visit: Payer: Self-pay | Admitting: Genetic Counselor

## 2021-06-06 ENCOUNTER — Encounter: Payer: Self-pay | Admitting: Genetic Counselor

## 2021-06-06 DIAGNOSIS — Z1379 Encounter for other screening for genetic and chromosomal anomalies: Secondary | ICD-10-CM

## 2021-06-06 DIAGNOSIS — Z853 Personal history of malignant neoplasm of breast: Secondary | ICD-10-CM | POA: Diagnosis not present

## 2021-06-06 DIAGNOSIS — Z803 Family history of malignant neoplasm of breast: Secondary | ICD-10-CM | POA: Diagnosis not present

## 2021-06-06 DIAGNOSIS — Z800 Family history of malignant neoplasm of digestive organs: Secondary | ICD-10-CM

## 2021-06-06 LAB — GENETIC SCREENING ORDER

## 2021-06-23 ENCOUNTER — Encounter: Payer: Self-pay | Admitting: Genetic Counselor

## 2021-06-23 ENCOUNTER — Telehealth: Payer: Self-pay | Admitting: Genetic Counselor

## 2021-06-23 DIAGNOSIS — Z1379 Encounter for other screening for genetic and chromosomal anomalies: Secondary | ICD-10-CM | POA: Insufficient documentation

## 2021-06-23 NOTE — Telephone Encounter (Signed)
I attempted to contact Ms. Revere to discuss her genetic testing results (47 genes). I left a voicemail requesting she call me back at [phone]. ? ?Lucille Passy, MS, LCGC ?Genetic Counselor ?[email or web address] ?(P) [phone] ? ?

## 2021-06-26 ENCOUNTER — Encounter: Payer: Self-pay | Admitting: Genetic Counselor

## 2021-06-26 ENCOUNTER — Telehealth: Payer: Self-pay | Admitting: Genetic Counselor

## 2021-06-26 ENCOUNTER — Telehealth: Payer: Self-pay

## 2021-06-26 NOTE — Telephone Encounter (Signed)
I contacted [name] to discuss her genetic testing results. No pathogenic variants were identified in the 48 genes analyzed. Detailed clinic note to follow. ? ?The test report has been scanned into EPIC and is located under the Molecular Pathology section of the Results Review tab.  A portion of the result report is included below for reference.  ? ?Vanessa Passy, MS, [place] ?Genetic Counselor ?[email or web address] ?(P) [phone] ? ? ?

## 2021-06-26 NOTE — Telephone Encounter (Signed)
Called the patient to schedule an appointment with Dr. Bryan Lemma for Pancreatic CA Screening. Stated she is driving and will call me back later. ?

## 2021-06-26 NOTE — Telephone Encounter (Signed)
-----   Message from Lavena Bullion, DO sent at 06/26/2021  1:53 PM EDT ----- ?Regarding: FW: Pancreatic Cancer Screening ?Charle Clear, ? ?Can you please schedule OV w/ me to discuss Pancreatic CA screening? Thanks.  ? ?----- Message ----- ?From: Katheren Shams, Counselor ?Sent: 06/26/2021   1:22 PM EDT ?To: Lavena Bullion, DO ?Subject: Pancreatic Cancer Screening                   ? ?Dr. Bryan Lemma, ? ?Ms. Wigle, a 56 y.o. female, would like a referral for pancreatic cancer screening. She has a personal history of breast cancer diagnosed at age 59. Her father and paternal aunt (father's only sibling) died due to pancreatic cancer. Additional details can be found in my clinic note. Her genetic testing was Negative.  ? ?Thanks! ?Mel Almond ? ? ?

## 2021-07-03 ENCOUNTER — Ambulatory Visit: Payer: Self-pay | Admitting: Genetic Counselor

## 2021-07-03 DIAGNOSIS — Z1379 Encounter for other screening for genetic and chromosomal anomalies: Secondary | ICD-10-CM

## 2021-07-03 NOTE — Progress Notes (Signed)
HPI:   ?Ms. Dondlinger was previously seen in the [place] clinic due to a personal and family history of cancer and concerns regarding a hereditary predisposition to cancer. Please refer to our prior cancer genetics clinic note for more information regarding our discussion, assessment and recommendations, at the time. Ms. Gan's recent genetic test results were disclosed to her, as were recommendations warranted by these results. These results and recommendations are discussed in more detail below. ? ?CANCER HISTORY:  ?Oncology History  ? No history exists.  ? ? ?FAMILY HISTORY:  ?We obtained a detailed, 4-generation family history.  Significant diagnoses are listed below: ?Family History  ?Problem Relation Age of Onset  ? Breast cancer Mother 61  ? Asthma Father   ? Allergic rhinitis Father   ? Pancreatic cancer Father 5  ? Breast cancer Sister 52  ? Pancreatic cancer Paternal Aunt 7  ? Asthma Maternal Grandmother   ? Lung cancer Paternal Grandfather   ? Breast cancer Cousin   ?     maternal first cousin, dx. 29s  ? Eczema Neg Hx   ? Urticaria Neg Hx   ? ? ?    ?  ?  ?  ?Ms. Armendarez's sister was diagnosed with lobular breast cancer at age 71. Her mother was diagnosed with breast cancer at age 73 and had a unilateral mastectomy, she died at age 76. Ms. Pickler has a maternal cousin who was diagnosed with breast cancer in her 66s and a maternal great aunt (grandfather's sister) who was diagnosed with an unknown cancer, she is deceased. ?  ?Ms. Elliot's father recently passed away due to pancreatic cancer at the age of 48, he was diagnosed at age 11. Her paternal aunt (her father's only sibling) also died due to pancreatic cancer, she was diagnosed at age 57. Her paternal grandfather was diagnosed with lung cancer, he smoked and died in his 38s. ?  ?There is no reported Ashkenazi Jewish ancestry. ? ?GENETIC TEST RESULTS:  ?The [facility] CustomNext Cancer Panel found no pathogenic mutations. ? ?The  CustomNext-Cancer+RNAinsight panel offered by [facility] includes sequencing and rearrangement analysis for the following 47 genes:  APC, ATM, AXIN2, BARD1, BMPR1A, BRCA1, BRCA2, BRIP1, CDH1, CDK4, CDKN2A, CHEK2, CTNNA1, DICER1, EPCAM, GREM1, HOXB13, KIT, MEN1, MLH1, MSH2, MSH3, MSH6, MUTYH, NBN, NF1, NTHL1, PALB2, PDGFRA, PMS2, POLD1, POLE, PTEN, RAD50, RAD51C, RAD51D, SDHA, SDHB, SDHC, SDHD, SMAD4, SMARCA4, STK11, TP53, TSC1, TSC2, and VHL.  RNA data is routinely analyzed for use in variant interpretation for all genes. ? ?The test report has been scanned into EPIC and is located under the Molecular Pathology section of the Results Review tab.  A portion of the result report is included below for reference. Genetic testing reported out on 06/19/2021.  ? ? ? ? ? ? ?Even though a pathogenic variant was not identified, possible explanations for the cancer in the family may include: ?There may be no hereditary risk for cancer in the family. The cancers in Ms. Musolf and/or her family may be due to other genetic or environmental factors. ?There may be a gene mutation in one of these genes that current testing methods cannot detect, but that chance is small. ?There could be another gene that has not yet been discovered, or that we have not yet tested, that is responsible for the cancer diagnoses in the family.  ?It is also possible there is a hereditary cause for the cancer in the family that Ms. Paradise did not inherit. ? ?  Therefore, it is important to remain in touch with cancer genetics in the future so that we can continue to offer Ms. Cue the most up to date genetic testing.  ? ?ADDITIONAL GENETIC TESTING:  ?We discussed with Ms. Selden that her genetic testing was fairly extensive.  If there are genes identified to increase cancer risk that can be analyzed in the future, we would be happy to discuss and coordinate this testing at that time.   ? ?CANCER SCREENING RECOMMENDATIONS:  ?Ms. Sahr's test  result is considered negative (normal).  This means that we have not identified a hereditary cause for her personal and family history of cancer at this time. Most cancers happen by chance and this negative test suggests that her cancer may fall into this category.   ? ?An individual's cancer risk and medical management are not determined by genetic test results alone. Overall cancer risk assessment incorporates additional factors, including personal medical history, family history, and any available genetic information that may result in a personalized plan for cancer prevention and surveillance. Therefore, it is recommended she continue to follow the cancer management and screening guidelines provided by her oncology and primary healthcare provider. ? ?Based on the reported personal and family history, specific cancer screenings for Ms. Avery Dennison and her family include: ? ?Pancreatic Cancer Screening/Risk Reduction: ?Avoid smoking, heavy alcohol use, and obesity. ?The American Gastroenterological Association (AGA) recommends the consideration of pancreatic cancer screening in patients determined to be at high risk, including first-degree relatives of patients with pancreatic cancer with at least two affected genetically related relatives.  ?Ms. Elliot's father and paternal aunt had a history of pancreatic cancer. Therefore, we have placed a referral to Dr. Bryan Lemma at [facility].  ? ?RECOMMENDATIONS FOR FAMILY MEMBERS:   ?Since she did not inherit a mutation in a cancer predisposition gene included on this panel, her son could not have inherited a mutation from her in one of these genes. ?Individuals in this family might be at some increased risk of developing cancer, over the general population risk, due to the family history of cancer. We recommend women in this family have a yearly mammogram beginning at age 58, or 20 years younger than the earliest onset of cancer, an annual clinical breast  exam, and perform monthly breast self-exams.  ?Other members of the family may still carry a pathogenic variant in one of these genes that Ms. Cerritos did not inherit. Based on the family history, we recommend her siblings have genetic counseling and testing.  ? ?FOLLOW-UP:  ?Cancer genetics is a rapidly advancing field and it is possible that new genetic tests will be appropriate for her and/or her family members in the future. We encouraged her to remain in contact with cancer genetics on an annual basis so we can update her personal and family histories and let her know of advances in cancer genetics that may benefit this family.  ? ?Our contact number was provided. Ms. Lorge's questions were answered to her satisfaction, and she knows she is welcome to call us at anytime with additional questions or concerns.  ? ?Lucille Passy, MS, [place] ?Genetic Counselor ?[email or web address] ?(P) [phone] ? ? ?

## 2021-07-04 NOTE — Telephone Encounter (Signed)
Appointment scheduled with patient on 07/06/21 at 9:20 am. ?

## 2021-07-06 ENCOUNTER — Encounter: Payer: Self-pay | Admitting: Gastroenterology

## 2021-07-06 ENCOUNTER — Other Ambulatory Visit (INDEPENDENT_AMBULATORY_CARE_PROVIDER_SITE_OTHER): Payer: BC Managed Care – PPO

## 2021-07-06 ENCOUNTER — Ambulatory Visit (INDEPENDENT_AMBULATORY_CARE_PROVIDER_SITE_OTHER): Payer: BC Managed Care – PPO | Admitting: Gastroenterology

## 2021-07-06 VITALS — BP 100/62 | HR 68 | Ht 66.0 in | Wt 163.5 lb

## 2021-07-06 DIAGNOSIS — Z9189 Other specified personal risk factors, not elsewhere classified: Secondary | ICD-10-CM

## 2021-07-06 DIAGNOSIS — Z1211 Encounter for screening for malignant neoplasm of colon: Secondary | ICD-10-CM | POA: Diagnosis not present

## 2021-07-06 DIAGNOSIS — Z8371 Family history of colonic polyps: Secondary | ICD-10-CM

## 2021-07-06 DIAGNOSIS — Z800 Family history of malignant neoplasm of digestive organs: Secondary | ICD-10-CM

## 2021-07-06 LAB — HEMOGLOBIN A1C: Hgb A1c MFr Bld: 5.9 % (ref 4.6–6.5)

## 2021-07-06 NOTE — Patient Instructions (Addendum)
If you are age 54 or younger, your body mass index should be between 19-25. Your Body mass index is 26.39 kg/m?Marland Kitchen If this is out of the aformentioned range listed, please consider follow up with your Primary Care Provider.  ? ?__________________________________________________________ ? ?The [facility] providers would like to encourage you to use [facility] to communicate with providers for non-urgent requests or questions.  Due to long hold times on the telephone, sending your provider a message by [facility] may be a faster and more efficient way to get a response.  Please allow 48 business hours for a response.  Please remember that this is for non-urgent requests.  ? ?Due to recent changes in healthcare laws, you may see the results of your imaging and laboratory studies on MyChart before your provider has had a chance to review them.  We understand that in some cases there may be results that are confusing or concerning to you. Not all laboratory results come back in the same time frame and the provider may be waiting for multiple results in order to interpret others.  Please give [place] 48 hours in order for your provider to thoroughly review all the results before contacting the office for clarification of your results.  ? ?Please go to the lab on the 2nd floor [address] before you leave the office today.   ? ?We have given you samples of the following medication to take: Sutab ? ?You have been scheduled for an endoscopy and colonoscopy. Please follow the written instructions given to you at your visit today. ?Please pick up your prep supplies at the pharmacy within the next 1-3 days. ?If you use inhalers (even only as needed), please bring them with you on the day of your procedure.  ? ?Please Follow up in 6 months. ? ?You have been scheduled for an MRI at [facility] on 07/11/21. Your appointment time is 9:00 pm. Please arrive to admitting (at main entrance of the hospital) 30 minutes prior to your appointment  time for registration purposes. Please make certain not to have anything to eat or drink 4 hours prior to your test. In addition, if you have any metal in your body, have a pacemaker or defibrillator, please be sure to let your ordering physician know. This test typically takes 45 minutes to 1 hour to complete. Should you need to reschedule, please call [phone] to do so.  ? ?Thank you for choosing me and [facility]. ? ?Gerrit Heck, D.O. ? ? ? ?We want to thank you for trusting [facility] with your care. All of our staff and providers value the relationships we have built with our patients, and it is an honor to care for you.  ? ?We are writing to let you know that [facility] will close on Jul 17, 2021, and we invite you to continue to see Dr. Carmell Austria and Gerrit Heck at the [facility] [place] office location. We are consolidating our serices at these [facility] practices to better provide care. Our office staff will work with you to ensure a seamless transition.  ? ?Gerrit Heck, DO -Dr. Bryan Lemma will be movig to [facility] at [address], [place], [place] [address], effective Jul 17, 2021.  Contact [phone] to schedule an appointment with him.  ? ?Carmell Austria, MD- Dr. Lyndel Safe will be movig to [facility] at [address] [place], [place] [address], effective Jul 17, 2021.  Contact [phone] to  schedule an appointment with him.  ? ?Requesting Medical Records ?If you need to request your medical records, please follow the instructions below. Your medical records are confidential, and a copy can be transferred to another provider or released to you or another person you designate only with your permission. ? ?There are several ways to request your medical records: ?Requests for medical records can be submitted through our practice.   ?You can also request your records electronically, in  your MyChart account by selecting the ?Request Health Records? tab.  ?If you need additional information on how to request records, please go to [email or web address]/, choose Patient Information, then select Request Medical Records. ?To make an appointment or if you have any questions about your health care needs, please contact our office at [phone] and one of our staff members will be glad to assist you. ?[facility] is committed to providing exceptional care for you and our community. Thank you for allowing [place] to serve your health care needs. ?Sincerely, ? ?Windy Canny, Director [facility] ? also offers convenient virtual care options. Sore throat? Sinus problems? Cold or flu symptoms? Get care from the comfort of home with [facility] Video Visits and e-Visits. Learn more about the non-emergency conditions treated and start your virtual visit at [email or web address]  ? ?

## 2021-07-06 NOTE — Progress Notes (Signed)
? ? ?          ?Chief Complaint: Discuss Pancreatic Cancer screening ? ? ?Referring Provider:     Lucille Passy, MS, [facility]) ? ? ? ?HPI:   ? ? ?Vanessa Goodman is a 56 y.o. 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 ? ?She has no known history of pancreatic or hepatobiliary disease.  Denies any history of jaundice, ascites, icteric sclera.  No abdominal pain, weight loss, early satiety. ? ?Her father was diagnosed with metastatic Pancreatic Cancer earlier this year, and died within just 3 weeks of diagnosis. ? ? ?Family history: ?- Mother: Breast cancer age 38, large colon polyp requiring surgical resection age 76 ?- Father: Pancreatic cancer age 60 ?- Sister: Breast cancer age 54 ?- Paternal aunt: Pancreatic cancer age 48 ?- PGF: Lung cancer ?- Maternal cousin: Breast cancer in her 47s ? ?Genetic Testing results: No pathogenic mutations ? ? ?Endoscopic History: ?- Colonoscopy (07/2014, [place]): Normal per patient. No report available for review, but recommended repeat in 5 years d/t FHx ? ?Past Medical History:  ?Diagnosis Date  ? Asthma   ? Breast cancer ([place])   ? dx at 42  ? Recurrent upper respiratory infection (URI)   ? ? ? ?Past Surgical History:  ?Procedure Laterality Date  ? COLONOSCOPY    ? [facility] thinks around age 70-50. Normal  ? MASTECTOMY Bilateral   ? ?Family History  ?Problem Relation Age of Onset  ? Breast cancer Mother 84  ? Colon polyps Mother   ?     said 16-7in of colon removed due to polyps  ? Asthma Father   ? Allergic rhinitis Father   ? Pancreatic cancer Father 19  ? Breast cancer Sister 43  ? Asthma Maternal Grandmother   ? Lung cancer Paternal Grandfather   ? Pancreatic cancer Paternal Aunt 60  ? Breast cancer Cousin   ?     mom's  neice dx 40's  ? Eczema Neg Hx   ? Urticaria Neg Hx   ? Esophageal cancer Neg Hx   ? ?Social History  ? ?Tobacco Use  ? Smoking status: Never  ? Smokeless tobacco: Never  ?Vaping Use  ? Vaping Use: Never used  ?Substance Use Topics  ? Alcohol use: Yes  ?  Comment: rare  ? Drug use: Never  ? ?Current Outpatient Medications  ?Medication Sig Dispense Refill  ? albuterol (PROAIR HFA) 108 (90 Base) MCG/ACT inhaler 2 puffs every 4 hours if needed for wheezing or coughing spells. 1 each 3  ? Cetirizine HCl 10 MG CAPS 1 capsule daily in the afternoon.    ? fluticasone (FLONASE) 50 MCG/ACT nasal spray 2 sprays per nostril once a day if needed for stuffy nose. (Patient taking differently: as needed. 2 sprays per nostril once a day if needed for stuffy nose.) 16 g 5  ? fluticasone-salmeterol (ADVAIR) 100-50 MCG/ACT AEPB Inhale 1 puff into the lungs 2 (two) times daily. 60 each 5  ? montelukast (SINGULAIR) 10 MG tablet TAKE 1 TABLET BY MOUTH DAILY TO PREVENT COUGHING OR WHEEZING 90 tablet 1  ? ?No current facility-administered medications for this visit.  ? ?Allergies  ?Allergen Reactions  ? Bee Venom Hives and Rash  ? ? ? ?Review of Systems: ?All  systems reviewed and negative except where noted in HPI.  ? ? ? ?Physical Exam:   ? ?Wt Readings from Last 3 Encounters:  ?07/06/21 163 lb 8 oz (74.2 kg)  ?08/18/20 161 lb (73 kg)  ?12/09/19 156 lb (70.8 kg)  ? ? ?Pulse 68   Ht '5\' 6"'$  (1.676 m)   Wt 163 lb 8 oz (74.2 kg)   BMI 26.39 kg/m?  ?Constitutional:  Pleasant, in no acute distress. ?Psychiatric: Normal mood and affect. Behavior is normal. ?EENT: Pupils normal.   ?Neurological: Alert and oriented to person place and time. ?Skin: Skin is warm and dry. No rashes noted. ? ? ?ASSESSMENT AND PLAN;  ? ?1) Family History of Pancreatic Cancer ?2) Personal Hx of Breast CA ?From most recent [place] and AGA guidelines, screening for pancreatic cancer recommended in this high risk individual based on the  following: ? ?-Individuals with at least one first-degree relative with pancreatic CA who in turn also has a first-degree relative with pancreatic CA (?2 affected, genetically-related family members; familial pancreatic cancer kindred) ? ?Recommended age to start surveillance varies by gene mutation status and family history, as follows: ?-Familial pancreatic cancer kindred (without a known germline mutation)-start at age 65, or 20 years younger than the youngest affected blood relative ? ? ?Screening techniques include the following: ?-Baseline: MRI/MRCP plus EUS plus fasting blood glucose and/or hemoglobin A1c  ?-Follow-up/surveillance phase: Alternate MRI/MRCP and EUS plus routine fasting blood glucose and/or hemoglobin A1c ?-If concerning features on imaging, check CA 19-9 ?-EUS with FNA for solid lesions ?5 mm, cystic lesions with worrisome features, or asymptomatic main pancreatic duct (MPD) strictures (with or without mass) ?-CT only for solid lesions, regardless of size, or asymptomatic MPD strictures of unknown etiology (without mass) ? ?-Screening interval every 12 months in patients with no abnormalities/nonconcerning abnormalities ?-Screening interval every 3-6 months in patients with abnormalities that are NOT suspicious for malignancy, but are concerning ?-EUS evaluation should be performed within 3-6 months for indeterminate lesions (abnormalities that are NOT suspicious for malignancy, but are concerning) ?-EUS evaluation should be performed within 3 months for high-risk lesions, if surgical resection is not planned. ?-Immediate surgical referral for abnormality suspicious for malignancy ? ?-New-onset diabetes in a high-risk individual should lead to additional diagnostic studies or change in surveillance interval ? ?-Genetic testing and counseling should be considered for familial pancreas cancer relatives who are eligible for surveillance. A positive germline mutation is associated with an  increased risk of neoplastic progression and may also lead to screening for other relevant associated cancers ?-Participation in a registry or referral to a pancreas Center of Excellence should be pursued when possible for high-risk patients undergoing pancreas cancer screening ?-The target detectable pancreatic neoplasms are resectable stage I pancreatic ductal adenocarcinoma and high-risk precursor neoplasms, such as intraductal papillary mucinous neoplasms with high-grade dysplasia and some enlarged pancreatic intraepithelial neoplasias ? ?-We discussed the limitations and potential risks of pancreas cancer screening prior to initiating any screening program, and the patient wishes to proceed with screening. Plan for the following: ? ?- MRI/MRCP now  ?- Hgb A1c now ?- EUS in 6 months ?- RTC every 6 months ?- If index MRI/MRCP and EUS are unrevealing/normal, per protocol, will liberalize to annual screening, alternating between MRI/MRCP and EUS ? ? ?3) Colon cancer screening ?4) Fhx of advanced colon polyps ?-Due for colonoscopy for ongoing CRC screening ?- Schedule colonoscopy ? ?The indications, risks, and benefits of colonoscopy were explained to the patient in  detail. Risks include but are not limited to bleeding, perforation, adverse reaction to medications, and cardiopulmonary compromise. Sequelae include but are not limited to the possibility of surgery, hospitalization, and mortality. The patient verbalized understanding and wished to proceed. All questions answered, referred to the scheduler and bowel prep ordered. Further recommendations pending results of the exam.  ? ?I spent 60 minutes of time, including in depth chart review, independent review of results as outlined above, communicating results with the patient directly, face-to-face time with the patient, coordinating care, ordering studies and medications as appropriate, and documentation.   ? ? ? ? ?Lavena Bullion, DO, FACG  07/06/2021, 9:27  AM ? ? ?Lavone Orn, MD ? ?

## 2021-07-10 ENCOUNTER — Telehealth: Payer: Self-pay

## 2021-07-10 NOTE — Telephone Encounter (Signed)
-----   Message from [place] sent at 07/06/2021 10:44 AM EDT ----- ?Regarding: EUS in 6 months ?Hi Ysela Hettinger, ? ?Dr. Bryan Lemma would like to schedule EUS in 6 months for this patient. I scheduled MRI/MRCP on 07/11/21. Thanks! ? ?[place] ? ?

## 2021-07-10 NOTE — Telephone Encounter (Signed)
Recall EUS has been entered  ?

## 2021-07-11 ENCOUNTER — Ambulatory Visit (HOSPITAL_COMMUNITY): Payer: BC Managed Care – PPO

## 2021-07-24 ENCOUNTER — Other Ambulatory Visit: Payer: Self-pay | Admitting: Gastroenterology

## 2021-07-24 DIAGNOSIS — Z9189 Other specified personal risk factors, not elsewhere classified: Secondary | ICD-10-CM

## 2021-07-24 DIAGNOSIS — Z800 Family history of malignant neoplasm of digestive organs: Secondary | ICD-10-CM

## 2021-07-24 DIAGNOSIS — Z1211 Encounter for screening for malignant neoplasm of colon: Secondary | ICD-10-CM

## 2021-07-25 ENCOUNTER — Encounter (HOSPITAL_COMMUNITY): Payer: Self-pay | Admitting: Radiology

## 2021-07-25 ENCOUNTER — Ambulatory Visit (HOSPITAL_COMMUNITY)
Admission: RE | Admit: 2021-07-25 | Discharge: 2021-07-25 | Disposition: A | Discharge status: Home / Self Care | Payer: BC Managed Care – PPO | Source: Ambulatory Visit | Attending: Gastroenterology | Admitting: Gastroenterology

## 2021-07-25 DIAGNOSIS — Z1211 Encounter for screening for malignant neoplasm of colon: Secondary | ICD-10-CM | POA: Diagnosis not present

## 2021-07-25 DIAGNOSIS — Z1289 Encounter for screening for malignant neoplasm of other sites: Secondary | ICD-10-CM | POA: Insufficient documentation

## 2021-07-25 DIAGNOSIS — Z800 Family history of malignant neoplasm of digestive organs: Secondary | ICD-10-CM | POA: Diagnosis not present

## 2021-07-25 DIAGNOSIS — Z803 Family history of malignant neoplasm of breast: Secondary | ICD-10-CM | POA: Insufficient documentation

## 2021-07-25 DIAGNOSIS — Z8507 Personal history of malignant neoplasm of pancreas: Secondary | ICD-10-CM | POA: Diagnosis not present

## 2021-07-25 DIAGNOSIS — Z9189 Other specified personal risk factors, not elsewhere classified: Secondary | ICD-10-CM | POA: Diagnosis not present

## 2021-07-25 DIAGNOSIS — Z9013 Acquired absence of bilateral breasts and nipples: Secondary | ICD-10-CM | POA: Diagnosis not present

## 2021-07-25 DIAGNOSIS — R935 Abnormal findings on diagnostic imaging of other abdominal regions, including retroperitoneum: Secondary | ICD-10-CM | POA: Diagnosis not present

## 2021-07-25 MED ORDER — GADOBUTROL 1 MMOL/ML IV SOLN
7.0000 mL | Freq: Once | INTRAVENOUS | Status: AC | PRN
  Administered 2021-07-25: 7 mL via INTRAVENOUS

## 2021-07-28 ENCOUNTER — Other Ambulatory Visit (HOSPITAL_COMMUNITY): Payer: BC Managed Care – PPO

## 2021-08-15 ENCOUNTER — Other Ambulatory Visit: Payer: Self-pay | Admitting: Family Medicine

## 2021-08-16 ENCOUNTER — Other Ambulatory Visit: Payer: Self-pay | Admitting: Family Medicine

## 2021-08-22 ENCOUNTER — Ambulatory Visit (AMBULATORY_SURGERY_CENTER): Payer: BC Managed Care – PPO | Admitting: Gastroenterology

## 2021-08-22 ENCOUNTER — Encounter: Payer: Self-pay | Admitting: Gastroenterology

## 2021-08-22 VITALS — BP 105/50 | HR 59 | Temp 97.7°F | Resp 13 | Ht 66.0 in | Wt 163.0 lb

## 2021-08-22 DIAGNOSIS — Z8371 Family history of colonic polyps: Secondary | ICD-10-CM | POA: Diagnosis not present

## 2021-08-22 DIAGNOSIS — Z1211 Encounter for screening for malignant neoplasm of colon: Secondary | ICD-10-CM

## 2021-08-22 DIAGNOSIS — Z800 Family history of malignant neoplasm of digestive organs: Secondary | ICD-10-CM | POA: Diagnosis not present

## 2021-08-22 MED ORDER — SODIUM CHLORIDE 0.9 % IV SOLN: 500.0000 mL | INTRAVENOUS | Status: DC

## 2021-08-22 NOTE — Patient Instructions (Signed)
   Recommend repeat Colonoscopy in 2 years   YOU HAD AN ENDOSCOPIC PROCEDURE TODAY AT [facility]:   Refer to the procedure report that was given to you for any specific questions about what was found during the examination.  If the procedure report does not answer your questions, please call your gastroenterologist to clarify.  If you requested that your care partner not be given the details of your procedure findings, then the procedure report has been included in a sealed envelope for you to review at your convenience later.  YOU SHOULD EXPECT: Some feelings of bloating in the abdomen. Passage of more gas than usual.  Walking can help get rid of the air that was put into your GI tract during the procedure and reduce the bloating. If you had a lower endoscopy (such as a colonoscopy or flexible sigmoidoscopy) you may notice spotting of blood in your stool or on the toilet paper. If you underwent a bowel prep for your procedure, you may not have a normal bowel movement for a few days.  Please Note:  You might notice some irritation and congestion in your nose or some drainage.  This is from the oxygen used during your procedure.  There is no need for concern and it should clear up in a day or so.  SYMPTOMS TO REPORT IMMEDIATELY:  Following lower endoscopy (colonoscopy or flexible sigmoidoscopy):  Excessive amounts of blood in the stool  Significant tenderness or worsening of abdominal pains  Swelling of the abdomen that is new, acute  Fever of 100F or higher   For urgent or emergent issues, a gastroenterologist can be reached at any hour by calling [phone]. Do not use MyChart messaging for urgent concerns.    DIET:  We do recommend a small meal at first, but then you may proceed to your regular diet.  Drink plenty of fluids but you should avoid alcoholic beverages for 24 hours.  ACTIVITY:  You should plan to take it easy for the rest of today and you should NOT DRIVE  or use heavy machinery until tomorrow (because of the sedation medicines used during the test).    FOLLOW UP: Our staff will call the number listed on your records 24-72 hours following your procedure to check on you and address any questions or concerns that you may have regarding the information given to you following your procedure. If we do not reach you, we will leave a message.  We will attempt to reach you two times.  During this call, we will ask if you have developed any symptoms of COVID 19. If you develop any symptoms (ie: fever, flu-like symptoms, shortness of breath, cough etc.) before then, please call [phone].  If you test positive for Covid 19 in the 2 weeks post procedure, please call and report this information to [place].    If any biopsies were taken you will be contacted by phone or by letter within the next 1-3 weeks.  Please call us at [phone] if you have not heard about the biopsies in 3 weeks.    SIGNATURES/CONFIDENTIALITY: You and/or your care partner have signed paperwork which will be entered into your electronic medical record.  These signatures attest to the fact that that the information above on your After Visit Summary has been reviewed and is understood.  Full responsibility of the confidentiality of this discharge information lies with you and/or your care-partner.

## 2021-08-22 NOTE — Progress Notes (Signed)
GASTROENTEROLOGY PROCEDURE H&P NOTE   Primary Care Physician: Lavone Orn, MD    Reason for Procedure:  Colon Cancer screening, Fhx of colon polyps  Plan:    Colonoscopy  Patient is appropriate for endoscopic procedure(s) in the ambulatory ([place]) setting.  The nature of the procedure, as well as the risks, benefits, and alternatives were carefully and thoroughly reviewed with the patient. Ample time for discussion and questions allowed. The patient understood, was satisfied, and agreed to proceed.     HPI: Vanessa Goodman is a 56 y.o. female who presents for colonoscopy for ongoing Colon Cancer screening.  No active GI symptoms.    Family history: - Mother: Breast cancer age 2, large colon polyp requiring surgical resection age 23 - Father: Pancreatic cancer age 55 - Sister: Breast cancer age 70 - Paternal aunt: Pancreatic cancer age 10 - PGF: Lung cancer - Maternal cousin: Breast cancer in her 53s  Genetic Testing results: No pathogenic mutations     Endoscopic History: - Colonoscopy (07/2014, [place]): Normal per patient. No report available for review, but recommended repeat in 5 years d/t FHx  Past Medical History:  Diagnosis Date   Asthma    Breast cancer ([place])    dx at 51   Recurrent upper respiratory infection (URI)     Past Surgical History:  Procedure Laterality Date   COLONOSCOPY     [facility] thinks around age 24-50. Normal   MASTECTOMY Bilateral     Prior to Admission medications   Medication Sig Start Date End Date Taking? Authorizing Provider  Cetirizine HCl 10 MG CAPS 1 capsule daily in the afternoon.   Yes [provider]  fluticasone-salmeterol (ADVAIR) 100-50 MCG/ACT AEPB Inhale 1 puff into the lungs 2 (two) times daily. 08/18/20  Yes Ambs, Kathrine Cords, FNP  montelukast (SINGULAIR) 10 MG tablet TAKE 1 TABLET BY MOUTH DAILY TO PREVENT COUGHING OR WHEEZING 08/16/21  Yes Ambs, Kathrine Cords, FNP  albuterol (PROAIR HFA) 108 (90 Base)  MCG/ACT inhaler 2 puffs every 4 hours if needed for wheezing or coughing spells. 08/18/20   Dara Hoyer, FNP  EPINEPHrine (EPIPEN JR) 0.15 MG/0.3ML injection Inject into the muscle. 03/23/21   [provider]  fluticasone (FLONASE) 50 MCG/ACT nasal spray 2 sprays per nostril once a day if needed for stuffy nose. Patient taking differently: as needed. 2 sprays per nostril once a day if needed for stuffy nose. 08/18/20   Dara Hoyer, FNP    Current Outpatient Medications  Medication Sig Dispense Refill   Cetirizine HCl 10 MG CAPS 1 capsule daily in the afternoon.     fluticasone-salmeterol (ADVAIR) 100-50 MCG/ACT AEPB Inhale 1 puff into the lungs 2 (two) times daily. 60 each 5   montelukast (SINGULAIR) 10 MG tablet TAKE 1 TABLET BY MOUTH DAILY TO PREVENT COUGHING OR WHEEZING 30 tablet 0   albuterol (PROAIR HFA) 108 (90 Base) MCG/ACT inhaler 2 puffs every 4 hours if needed for wheezing or coughing spells. 1 each 3   EPINEPHrine (EPIPEN JR) 0.15 MG/0.3ML injection Inject into the muscle.     fluticasone (FLONASE) 50 MCG/ACT nasal spray 2 sprays per nostril once a day if needed for stuffy nose. (Patient taking differently: as needed. 2 sprays per nostril once a day if needed for stuffy nose.) 16 g 5   Current Facility-Administered Medications  Medication Dose Route Frequency Provider Last Rate Last Admin   0.9 %  sodium chloride infusion  500 mL Intravenous Continuous  Montrelle Eddings V, DO        Allergies as of 08/22/2021 - Review Complete 08/22/2021  Allergen Reaction Noted   Bee venom Hives and Rash 09/24/2013    Family History  Problem Relation Age of Onset   Breast cancer Mother 29   Colon polyps Mother        said 31-7in of colon removed due to polyps   Asthma Father    Allergic rhinitis Father    Pancreatic cancer Father 60   Breast cancer Sister 59   Asthma Maternal Grandmother    Lung cancer Paternal Grandfather    Pancreatic cancer Paternal Aunt 25   Breast cancer  Cousin        mom's neice dx 46's   Eczema Neg Hx    Urticaria Neg Hx    Esophageal cancer Neg Hx     Social History   Socioeconomic History   Marital status: Married    Spouse name: Not on file   Number of children: Not on file   Years of education: Not on file   Highest education level: Not on file  Occupational History   Not on file  Tobacco Use   Smoking status: Never   Smokeless tobacco: Never  Vaping Use   Vaping Use: Never used  Substance and Sexual Activity   Alcohol use: Yes    Comment: rare   Drug use: Never   Sexual activity: Not on file  Other Topics Concern   Not on file  Social History Narrative   Not on file   Social Determinants of Health   Financial Resource Strain: Not on file  Food Insecurity: Not on file  Transportation Needs: Not on file  Physical Activity: Not on file  Stress: Not on file  Social Connections: Not on file  Intimate Partner Violence: Not on file    Physical Exam: Vital signs in last 24 hours: '@BP'$  126/70   Pulse (!) 56   Temp 97.7 F (36.5 C) (Temporal)   Ht '5\' 6"'$  (1.676 m)   Wt 163 lb (73.9 kg)   SpO2 100%   BMI 26.31 kg/m  GEN: NAD EYE: Sclerae anicteric ENT: MMM CV: Non-tachycardic Pulm: CTA b/l GI: Soft, NT/ND NEURO:  Alert & Oriented x [employer], DO [facility]   08/22/2021 8:00 AM

## 2021-08-22 NOTE — Progress Notes (Signed)
Pt's states no medical or surgical changes since previsit or office visit. 

## 2021-08-22 NOTE — Progress Notes (Signed)
Sedate, gd SR, tolerated procedure well, VSS, report to RN 

## 2021-08-22 NOTE — Op Note (Signed)
[facility] Patient Name: Vanessa Goodman Procedure Date: 08/22/2021 8:04 AM MRN: [id number] Endoscopist: Gerrit Heck , MD Age: 56 Referring MD:  Date of Birth: 1965/04/25 Gender: Female Account #: [id number] Procedure:                Colonoscopy Indications:              Colon cancer screening in patient at increased                            risk: Family history of 1st-degree relative with                            colon polyps                           Family history notable for mother with large,                            advanced colon polyp requiring surgical resection                            age 58.                           Endoscopic History:                           Colonoscopy (07/2014): Normal with recommended                            repeat in 5 years d/t FHx Medicines:                Monitored Anesthesia Care Procedure:                Pre-Anesthesia Assessment:                           - Prior to the procedure, a History and Physical                            was performed, and patient medications and                            allergies were reviewed. The patient's tolerance of                            previous anesthesia was also reviewed. The risks                            and benefits of the procedure and the sedation                            options and risks were discussed with the patient.                            All questions were answered, and informed consent  was obtained. Prior Anticoagulants: The patient has                            taken no previous anticoagulant or antiplatelet                            agents. ASA Grade Assessment: II - A patient with                            mild systemic disease. After reviewing the risks                            and benefits, the patient was deemed in                            satisfactory condition to undergo the procedure.                            After obtaining informed consent, the colonoscope                            was passed under direct vision. Throughout the                            procedure, the patient's blood pressure, pulse, and                            oxygen saturations were monitored continuously. The                            Olympus CF-HQ190L [id number]) Colonoscope was                            introduced through the anus and advanced to the the                            terminal ileum. The colonoscopy was performed                            without difficulty. The patient tolerated the                            procedure well. The quality of the bowel                            preparation was fair Nila Nephew). The terminal ileum,                            ileocecal valve, appendiceal orifice, and rectum                            were photographed. Scope In: 8:10:50 AM Scope Out: 8:32:42 AM Scope Withdrawal Time: 0 hours 15 minutes 37 seconds  Total Procedure Duration: 0 hours 21 minutes  52 seconds  Findings:                 The perianal and digital rectal examinations were                            normal.                           A moderate amount of liquid stool and solid food                            debris/seeds was found scattered throughout the                            colon, interfering with visualization. Lavage of                            the area was performed using copious amounts of tap                            water, resulting in clearance with fair                            visualization. The colonoscope clogged several                            times with solid food debris, which reduced the                            ability to perform adequate lavage and suction,                            limiting the visualization. Cannot rule out the                            presence of small of flat polyps in several areas                            of the colon.                            The remainder of the visualized colon was otherwise                            normal appearing.                           The retroflexed view of the distal rectum and anal                            verge was normal and showed no anal or rectal                            abnormalities.  The terminal ileum appeared normal. Complications:            No immediate complications. Estimated Blood Loss:     Estimated blood loss: none. Impression:               - Preparation of the colon was fair with retained                            liquid stool and solid food debris. This reduced                            visualization in several areas.                           - The remainder of the visualized colon was                            otherwise normal appearing.                           - The distal rectum and anal verge are normal on                            retroflexion view.                           - The examined portion of the ileum was normal.                           - No specimens collected. Recommendation:           - Patient has a contact number available for                            emergencies. The signs and symptoms of potential                            delayed complications were discussed with the                            patient. Return to normal activities tomorrow.                            Written discharge instructions were provided to the                            patient.                           - Resume previous diet.                           - Continue present medications.                           - Repeat colonoscopy in 2 years because the bowel  preparation was suboptimal. Plan for alternate                            preparation option.                           - Return to GI office PRN. Gerrit Heck, MD 08/22/2021 8:42:58 AM

## 2021-08-23 ENCOUNTER — Telehealth: Payer: Self-pay | Admitting: *Deleted

## 2021-08-23 NOTE — Telephone Encounter (Signed)
Attempted f/u phone call. No answer. Left message. °

## 2021-09-20 ENCOUNTER — Other Ambulatory Visit: Payer: Self-pay | Admitting: Family Medicine

## 2021-09-21 ENCOUNTER — Other Ambulatory Visit: Payer: Self-pay

## 2021-09-21 ENCOUNTER — Telehealth: Payer: Self-pay | Admitting: Internal Medicine

## 2021-09-21 MED ORDER — MONTELUKAST SODIUM 10 MG PO TABS: 10.0000 mg | ORAL_TABLET | Freq: Every day | ORAL | 0 refills | Status: DC

## 2021-09-21 NOTE — Telephone Encounter (Signed)
Sent in refill of montelukast

## 2021-09-21 NOTE — Telephone Encounter (Signed)
Patient is asking for a [place] refill on RX montelukast (SINGULAIR) 10 MG tablet [[id number]]  she has an appointment with Dr. Edison Pace on Monday the 24th please advise

## 2021-09-22 ENCOUNTER — Other Ambulatory Visit: Payer: Self-pay | Admitting: Family Medicine

## 2021-09-22 NOTE — Telephone Encounter (Signed)
Patient has an appointment on 09/25/21. It will be refilled at her appointment.

## 2021-09-25 ENCOUNTER — Ambulatory Visit: Payer: BC Managed Care – PPO | Admitting: Internal Medicine

## 2021-09-25 ENCOUNTER — Encounter: Payer: Self-pay | Admitting: Internal Medicine

## 2021-09-25 VITALS — BP 100/60 | HR 58 | Temp 97.9°F | Resp 17 | Ht 66.0 in | Wt 166.4 lb

## 2021-09-25 DIAGNOSIS — L2084 Intrinsic (allergic) eczema: Secondary | ICD-10-CM | POA: Diagnosis not present

## 2021-09-25 DIAGNOSIS — H524 Presbyopia: Secondary | ICD-10-CM | POA: Diagnosis not present

## 2021-09-25 DIAGNOSIS — H1045 Other chronic allergic conjunctivitis: Secondary | ICD-10-CM

## 2021-09-25 DIAGNOSIS — J3089 Other allergic rhinitis: Secondary | ICD-10-CM | POA: Diagnosis not present

## 2021-09-25 DIAGNOSIS — H1013 Acute atopic conjunctivitis, bilateral: Secondary | ICD-10-CM

## 2021-09-25 DIAGNOSIS — H2513 Age-related nuclear cataract, bilateral: Secondary | ICD-10-CM | POA: Diagnosis not present

## 2021-09-25 DIAGNOSIS — J4540 Moderate persistent asthma, uncomplicated: Secondary | ICD-10-CM

## 2021-09-25 DIAGNOSIS — T782XXD Anaphylactic shock, unspecified, subsequent encounter: Secondary | ICD-10-CM

## 2021-09-25 MED ORDER — EPINEPHRINE 0.3 MG/0.3ML IJ SOAJ: 0.3000 mg | INTRAMUSCULAR | 1 refills | Status: DC | PRN

## 2021-09-25 MED ORDER — TRIAMCINOLONE ACETONIDE 0.1 % EX OINT: 1.0000 | TOPICAL_OINTMENT | Freq: Two times a day (BID) | CUTANEOUS | 0 refills | Status: DC

## 2021-09-25 MED ORDER — MONTELUKAST SODIUM 10 MG PO TABS: 10.0000 mg | ORAL_TABLET | Freq: Every day | ORAL | 0 refills | Status: DC

## 2021-09-25 MED ORDER — FLUTICASONE-SALMETEROL 100-50 MCG/ACT IN AEPB: 1.0000 | INHALATION_SPRAY | Freq: Two times a day (BID) | RESPIRATORY_TRACT | 5 refills | Status: DC

## 2021-09-25 NOTE — Progress Notes (Signed)
Follow Up Note  RE: Vanessa Goodman MRN: [id number] DOB: September 21, 1965 Date of Office Visit: 09/25/2021  Referring provider: Lavone Orn, MD Primary care provider: Lavone Orn, MD  Chief Complaint: Asthma (Pt states she's been doing well, her asthma symptoms is well managed on current treatment.) and Allergic Rhinitis   History of Present Illness: I had the pleasure of seeing Vanessa Goodman for a follow up visit at the [facility] on 09/25/2021. She is a 56 y.o. female, who is being followed for persistent asthma, allergic rhinoconjunctivitits, stinging insect allergy. Her previous allergy office visit was on 08/08/20 with Gareth Morgan, [place]. Today is a regular follow up visit.  History obtained from patient  and  chart review  .  ASTHMA - Medical therapy: Advair 100 mcg 1 puff daily, montelukast 10 mg daily - Rescue inhaler use: rare use, usually during seasonal challenged  - Symptoms: Rare cough, wheeze, shortness of breath - Exacerbation history: 0 ABX for respiratory illness since last visit, 0 OCS, 0ED, 0 UC visits in the past year  - ACT: 25 /25 - Adverse effects of medication: denies  - Previous FEV1: 3.26 L,  - Biologic Labs not applicable   Allergic  Rhinitis: current therapy: zyrtec '10mg'$  daily, flonase as needed (usually during fall and spring),  symptoms improved symptoms include: nasal congestion, rhinorrhea, and post nasal drainage usually in spring and fall  She has had contact dermatitis from grass exposure this past spring, which she treats with hydrocortisone cream with some response  Previous allergy testing: yes: grass pollen, tree pollen, weed pollen, cat, and dust mite as listed below History of reflux/heartburn: no Interested in Allergy Immunotherapy: no  Stinging insect allergy Denies any field stings since last visit, she is not interested in venom immunotherapy.  She does have an EpiPen and anaphylaxis action plan   Assessment and  Plan: Vanessa Goodman is a 56 y.o. female with: Moderate persistent asthma without complication - Plan: Spirometry with Graph  Other allergic rhinitis  Other chronic allergic conjunctivitis of both eyes  Intrinsic atopic dermatitis Plan: Patient Instructions  Asthma; well controlled  Continue montelukast 10 mg-take 1 tablet once a day to prevent coughing or wheezing Continue Advair Diskus 100-50. Take 1 puff once a day to prevent coughing or wheezing Continue albuterol 2 puffs every 4 hours if needed for wheezing or coughing spells.   You may use albuterol 2 puffs 5 to 15 minutes before exercise to decrease cough or wheeze  Allergic rhinitis: well controlled  Continue cetirizine 10 mg-take 1 tablet once a day as needed for runny nose or itchy eyes Continue Flonase 2 sprays per nostril once a day if needed for stuffy nose Consider saline nasal rinses as needed for nasal symptoms. Use this before any medicated nasal sprays for best result Continue allergen avoidance measures directed toward, grass pollen, tree pollen, weed pollen, cat, and dust mite as listed below  Allergic conjunctivitis: well controlled  Some over the counter eye drops include Pataday one drop in each eye once a day as needed for red, itchy eyes OR Zaditor one drop in each eye twice a day as needed for red itchy eyes.  Dermatitis  - For flares start triamcinolone 0.1% ointment twice a day until rash resolves   Stinging insects Avoid stinging insects. In case of an allergic reaction, take Benadryl 50 mg every 4 hours, and if life-threatening symptoms occur, inject with AuviQ 0.3 mg.  Call the clinic if this treatment plan  is not working well for you  Follow up in 6 months or sooner if needed.  No follow-ups on file.  Meds ordered this encounter  Medications   triamcinolone ointment (KENALOG) 0.1 %    Sig: Apply 1 Application topically 2 (two) times daily.    Dispense:  30 g    Refill:  0   EPINEPHrine 0.3 mg/0.3 mL  IJ SOAJ injection    Sig: Inject 0.3 mg into the muscle as needed for anaphylaxis.    Dispense:  1 each    Refill:  1   fluticasone-salmeterol (ADVAIR) 100-50 MCG/ACT AEPB    Sig: Inhale 1 puff into the lungs 2 (two) times daily.    Dispense:  60 each    Refill:  5   montelukast (SINGULAIR) 10 MG tablet    Sig: Take 1 tablet (10 mg total) by mouth at bedtime.    Dispense:  30 tablet    Refill:  0    Courtesy refill. Patient needs an OV for further refills.    Lab Orders  No laboratory test(s) ordered today   Diagnostics: Spirometry:  Tracings reviewed. Her effort: Good reproducible efforts. FVC: 2.78 L FEV1: 2.52 L, 89% predicted FEV1/FVC ratio: 91% Interpretation: Spirometry consistent with normal pattern.  Please see scanned spirometry results for details.   Results interpreted by myself during this encounter and discussed with patient/family.   Medication List:  Current Outpatient Medications  Medication Sig Dispense Refill   EPINEPHrine 0.3 mg/0.3 mL IJ SOAJ injection Inject 0.3 mg into the muscle as needed for anaphylaxis. 1 each 1   triamcinolone ointment (KENALOG) 0.1 % Apply 1 Application topically 2 (two) times daily. 30 g 0   albuterol (PROAIR HFA) 108 (90 Base) MCG/ACT inhaler 2 puffs every 4 hours if needed for wheezing or coughing spells. 1 each 3   Cetirizine HCl 10 MG CAPS 1 capsule daily in the afternoon.     fluticasone (FLONASE) 50 MCG/ACT nasal spray 2 sprays per nostril once a day if needed for stuffy nose. (Patient taking differently: as needed. 2 sprays per nostril once a day if needed for stuffy nose.) 16 g 5   fluticasone-salmeterol (ADVAIR) 100-50 MCG/ACT AEPB Inhale 1 puff into the lungs 2 (two) times daily. 60 each 5   montelukast (SINGULAIR) 10 MG tablet Take 1 tablet (10 mg total) by mouth at bedtime. 30 tablet 0   No current facility-administered medications for this visit.   Allergies: Allergies  Allergen Reactions   Bee Venom Hives and  Rash   I reviewed her past medical history, social history, family history, and environmental history and no significant changes have been reported from her previous visit.  ROS: All others negative except as noted per HPI.   Objective: BP 100/60   Pulse (!) 58   Temp 97.9 F (36.6 C) (Temporal)   Resp 17   Ht '5\' 6"'$  (1.676 m)   Wt 166 lb 6.4 oz (75.5 kg)   SpO2 96%   BMI 26.86 kg/m  Body mass index is 26.86 kg/m. General Appearance:  Alert, cooperative, no distress, appears stated age  Head:  Normocephalic, without obvious abnormality, atraumatic  Eyes:  Conjunctiva clear, EOM's intact  Nose: Nares normal, normal mucosa, no visible anterior polyps, and septum midline  Throat: Lips, tongue normal; teeth and gums normal, normal posterior oropharynx  Neck: Supple, symmetrical  Lungs:   clear to auscultation bilaterally, Respirations unlabored, no coughing  Heart:  regular rate and rhythm and no  murmur, Appears well perfused  Extremities: No edema  Skin: Skin color, texture, turgor normal, no rashes or lesions on visualized portions of skin  Neurologic: No gross deficits   Previous notes and tests were reviewed. The plan was reviewed with the patient/family, and all questions/concerned were addressed.  It was my pleasure to see Vanessa Goodman today and participate in her care. Please feel free to contact me with any questions or concerns.  Sincerely,  Roney Marion, MD  Allergy & Immunology  [facility] [place] Office: [phone]

## 2021-09-25 NOTE — Patient Instructions (Addendum)
Asthma; well controlled  Continue montelukast 10 mg-take 1 tablet once a day to prevent coughing or wheezing Continue Advair Diskus 100-50. Take 1 puff once a day to prevent coughing or wheezing Continue albuterol 2 puffs every 4 hours if needed for wheezing or coughing spells.   You may use albuterol 2 puffs 5 to 15 minutes before exercise to decrease cough or wheeze  Allergic rhinitis: well controlled  Continue cetirizine 10 mg-take 1 tablet once a day as needed for runny nose or itchy eyes Continue Flonase 2 sprays per nostril once a day if needed for stuffy nose Consider saline nasal rinses as needed for nasal symptoms. Use this before any medicated nasal sprays for best result Continue allergen avoidance measures directed toward, grass pollen, tree pollen, weed pollen, cat, and dust mite as listed below  Allergic conjunctivitis: well controlled  Some over the counter eye drops include Pataday one drop in each eye once a day as needed for red, itchy eyes OR Zaditor one drop in each eye twice a day as needed for red itchy eyes.  Dermatitis  - For flares start triamcinolone 0.1% ointment twice a day until rash resolves   Stinging insects Avoid stinging insects. In case of an allergic reaction, take Benadryl 50 mg every 4 hours, and if life-threatening symptoms occur, inject with AuviQ 0.3 mg.  Call the clinic if this treatment plan is not working well for you  Follow up in 6 months or sooner if needed.

## 2021-10-11 DIAGNOSIS — R232 Flushing: Secondary | ICD-10-CM | POA: Diagnosis not present

## 2021-10-11 DIAGNOSIS — R42 Dizziness and giddiness: Secondary | ICD-10-CM | POA: Diagnosis not present

## 2021-10-11 DIAGNOSIS — R61 Generalized hyperhidrosis: Secondary | ICD-10-CM | POA: Diagnosis not present

## 2021-10-11 DIAGNOSIS — R5383 Other fatigue: Secondary | ICD-10-CM | POA: Diagnosis not present

## 2021-11-20 ENCOUNTER — Other Ambulatory Visit: Payer: Self-pay

## 2021-11-20 MED ORDER — MONTELUKAST SODIUM 10 MG PO TABS: 10.0000 mg | ORAL_TABLET | Freq: Every day | ORAL | 0 refills | Status: DC

## 2021-11-20 MED ORDER — MONTELUKAST SODIUM 10 MG PO TABS: 10.0000 mg | ORAL_TABLET | Freq: Every day | ORAL | 3 refills | Status: DC

## 2021-12-28 DIAGNOSIS — R051 Acute cough: Secondary | ICD-10-CM | POA: Diagnosis not present

## 2021-12-28 DIAGNOSIS — J018 Other acute sinusitis: Secondary | ICD-10-CM | POA: Diagnosis not present

## 2022-01-16 DIAGNOSIS — Z6826 Body mass index (BMI) 26.0-26.9, adult: Secondary | ICD-10-CM | POA: Diagnosis not present

## 2022-01-16 DIAGNOSIS — Z124 Encounter for screening for malignant neoplasm of cervix: Secondary | ICD-10-CM | POA: Diagnosis not present

## 2022-01-16 DIAGNOSIS — Z01419 Encounter for gynecological examination (general) (routine) without abnormal findings: Secondary | ICD-10-CM | POA: Diagnosis not present

## 2022-02-16 ENCOUNTER — Ambulatory Visit: Payer: BC Managed Care – PPO | Admitting: Internal Medicine

## 2022-02-16 ENCOUNTER — Other Ambulatory Visit: Payer: Self-pay

## 2022-02-16 ENCOUNTER — Encounter: Payer: Self-pay | Admitting: Internal Medicine

## 2022-02-16 VITALS — BP 122/70 | HR 75 | Temp 98.0°F | Resp 20 | Ht 66.0 in | Wt 166.0 lb

## 2022-02-16 DIAGNOSIS — L308 Other specified dermatitis: Secondary | ICD-10-CM

## 2022-02-16 DIAGNOSIS — T63441D Toxic effect of venom of bees, accidental (unintentional), subsequent encounter: Secondary | ICD-10-CM

## 2022-02-16 DIAGNOSIS — J4540 Moderate persistent asthma, uncomplicated: Secondary | ICD-10-CM | POA: Diagnosis not present

## 2022-02-16 DIAGNOSIS — J3089 Other allergic rhinitis: Secondary | ICD-10-CM | POA: Diagnosis not present

## 2022-02-16 DIAGNOSIS — T781XXA Other adverse food reactions, not elsewhere classified, initial encounter: Secondary | ICD-10-CM

## 2022-02-16 DIAGNOSIS — H1013 Acute atopic conjunctivitis, bilateral: Secondary | ICD-10-CM | POA: Diagnosis not present

## 2022-02-16 DIAGNOSIS — H1010 Acute atopic conjunctivitis, unspecified eye: Secondary | ICD-10-CM

## 2022-02-16 DIAGNOSIS — J45998 Other asthma: Secondary | ICD-10-CM | POA: Diagnosis not present

## 2022-02-16 MED ORDER — FLUTICASONE-SALMETEROL 45-21 MCG/ACT IN AERO: 2.0000 | INHALATION_SPRAY | Freq: Two times a day (BID) | RESPIRATORY_TRACT | 12 refills | Status: DC

## 2022-02-16 NOTE — Progress Notes (Signed)
Follow Up Note  RE: Vanessa Goodman MRN: [id number] DOB: [birth year] Date of Office Visit: 02/16/2022  Referring provider: Lavone Orn, MD Primary care provider: Lavone Orn, MD  Chief Complaint: Other (Medication review)  History of Present Illness: I had the pleasure of seeing Vanessa Goodman for a follow up visit at the [facility] [place] on 02/16/2022. She is a 56 y.o. female, who is being followed for persistent asthma, allergic rhinitis, dermatitis, stinging insect allergy. Her previous allergy office visit was on 09/25/21 with Dr. Edison Pace. Today is a regular follow up visit.  History obtained from patient, chart review.  ASTHMA - Medical therapy: Advair 100 mcg 1 puff daily, montelukast 10 mg daily -She received a letter from [employer] that Advair Diskus will no longer be on her formulary for 2024.  She made this appointment to evaluate other options. - Rescue inhaler use: rare use, usually during seasonal challenged  - Symptoms: Denies any cough, wheeze, dyspnea - Exacerbation history: 0 ABX for respiratory illness since last visit, 0 OCS, 0ED, 0 UC visits in the past year  - ACT: 25 /25 - Adverse effects of medication: denies  - Previous FEV1: 3.26 L,  - Biologic Labs not applicable   She does feel like she is having more food sensitivities and is interested in updated food allergy testing.  Her rhinitis is well-controlled with Zyrtec 10 mg daily, with the addition of Flonase in the spring.  She has not had any recurrent contact dermatitis from grass exposure although this usually only occurs in the spring.  She does have a history of stinging insect allergy.  Denies any field stings since last visit.  She is not interested in venom immunotherapy and does have an EpiPen.  Assessment and Plan: Kayly is a 55 y.o. female with: Moderate persistent asthma without complication - Plan: Spirometry with Graph  Other allergic  rhinitis  Seasonal allergic conjunctivitis  Toxic effect of venom of bees, unintentional, subsequent encounter  Other specified dermatitis  Adverse food reaction, initial encounter Plan: Patient Instructions  Asthma; well controlled  Continue montelukast 10 mg-take 1 tablet once a day to prevent coughing or wheezing Start Advair 48mg 2 puffs twice daily with spacer (used reviewed and sample given today)  -This should be covered for 2024 Continue albuterol 2 puffs every 4 hours if needed for wheezing or coughing spells.   You may use albuterol 2 puffs 5 to 15 minutes before exercise to decrease cough or wheeze   Food Sensitivities:  -Keep food journal for one month.  We can do targeted food testing at follow to any trigger foods you identify   Allergic rhinitis: well controlled  Continue cetirizine 10 mg-take 1 tablet once a day as needed for runny nose or itchy eyes Continue Flonase 2 sprays per nostril once a day if needed for stuffy nose Consider saline nasal rinses as needed for nasal symptoms. Use this before any medicated nasal sprays for best result Continue allergen avoidance measures directed toward, grass pollen, tree pollen, weed pollen, cat, and dust mite as listed below  Allergic conjunctivitis: well controlled  Some over the counter eye drops include Pataday one drop in each eye once a day as needed for red, itchy eyes OR Zaditor one drop in each eye twice a day as needed for red itchy eyes.  Dermatitis  - For flares start triamcinolone 0.1% ointment twice a day until rash resolves   Stinging insects Avoid  stinging insects. In case of an allergic reaction, take Benadryl 50 mg every 4 hours, and if life-threatening symptoms occur, inject with AuviQ 0.3 mg.  Call the clinic if this treatment plan is not working well for you  Follow up in 6 months or sooner if you would like to pursue food testing   Thank you so much for letting me partake in your care today.  Don't  hesitate to reach out if you have any additional concerns!  Roney Marion, MD  [facility] [place]     No follow-ups on file.  Meds ordered this encounter  Medications   fluticasone-salmeterol (ADVAIR HFA) 45-21 MCG/ACT inhaler    Sig: Inhale 2 puffs into the lungs 2 (two) times daily.    Dispense:  1 each    Refill:  12    Lab Orders  No laboratory test(s) ordered today   Diagnostics: Spirometry:  Tracings reviewed. Her effort: Good reproducible efforts. FVC: 3.14 L FEV1: 2.62 L, 90% predicted FEV1/FVC ratio: 83% Interpretation: Spirometry consistent with normal pattern.  Please see scanned spirometry results for details.  Skin Testing: None.  Results interpreted by myself during this encounter and discussed with patient/family.   Medication List:  Current Outpatient Medications  Medication Sig Dispense Refill   albuterol (PROAIR HFA) 108 (90 Base) MCG/ACT inhaler 2 puffs every 4 hours if needed for wheezing or coughing spells. 1 each 3   Cetirizine HCl 10 MG CAPS 1 capsule daily in the afternoon.     EPINEPHrine 0.3 mg/0.3 mL IJ SOAJ injection Inject 0.3 mg into the muscle as needed for anaphylaxis. 1 each 1   fluticasone (FLONASE) 50 MCG/ACT nasal spray 2 sprays per nostril once a day if needed for stuffy nose. (Patient taking differently: as needed. 2 sprays per nostril once a day if needed for stuffy nose.) 16 g 5   fluticasone-salmeterol (ADVAIR HFA) 45-21 MCG/ACT inhaler Inhale 2 puffs into the lungs 2 (two) times daily. 1 each 12   montelukast (SINGULAIR) 10 MG tablet Take 1 tablet (10 mg total) by mouth at bedtime. 30 tablet 3   triamcinolone ointment (KENALOG) 0.1 % Apply 1 Application topically 2 (two) times daily. 30 g 0   No current facility-administered medications for this visit.   Allergies: Allergies  Allergen Reactions   Bee Venom Hives and Rash   I reviewed her past medical history, social history, family history, and  environmental history and no significant changes have been reported from her previous visit.  ROS: All others negative except as noted per HPI.   Objective: BP 122/70   Pulse 75   Temp 98 F (36.7 C)   Resp 20   Ht '5\' 6"'$  (1.676 m)   Wt 166 lb (75.3 kg)   SpO2 97%   BMI 26.79 kg/m  Body mass index is 26.79 kg/m. General Appearance:  Alert, cooperative, no distress, appears stated age  Head:  Normocephalic, without obvious abnormality, atraumatic  Eyes:  Conjunctiva clear, EOM's intact  Nose: Nares normal,   Throat: Lips, tongue normal; teeth and gums normal,   Neck: Supple, symmetrical  Lungs:   , Respirations unlabored, no coughing  Heart:  , Appears well perfused  Extremities: No edema  Skin: Skin color, texture, turgor normal, no rashes or lesions on visualized portions of skin   Neurologic: No gross deficits   Previous notes and tests were reviewed. The plan was reviewed with the patient/family, and all questions/concerned were addressed.  It  was my pleasure to see Amarionna today and participate in her care. Please feel free to contact me with any questions or concerns.  Sincerely,  Roney Marion, MD  Allergy & Immunology  [facility] [place] [facility] Office: [phone]

## 2022-02-16 NOTE — Patient Instructions (Addendum)
Asthma; well controlled  Continue montelukast 10 mg-take 1 tablet once a day to prevent coughing or wheezing Start Advair 7mg 2 puffs twice daily with spacer (used reviewed and sample given today)  -This should be covered for 2024 Continue albuterol 2 puffs every 4 hours if needed for wheezing or coughing spells.   You may use albuterol 2 puffs 5 to 15 minutes before exercise to decrease cough or wheeze   Food Sensitivities:  -Keep food journal for one month.  We can do targeted food testing at follow to any trigger foods you identify   Allergic rhinitis: well controlled  Continue cetirizine 10 mg-take 1 tablet once a day as needed for runny nose or itchy eyes Continue Flonase 2 sprays per nostril once a day if needed for stuffy nose Consider saline nasal rinses as needed for nasal symptoms. Use this before any medicated nasal sprays for best result Continue allergen avoidance measures directed toward, grass pollen, tree pollen, weed pollen, cat, and dust mite as listed below  Allergic conjunctivitis: well controlled  Some over the counter eye drops include Pataday one drop in each eye once a day as needed for red, itchy eyes OR Zaditor one drop in each eye twice a day as needed for red itchy eyes.  Dermatitis  - For flares start triamcinolone 0.1% ointment twice a day until rash resolves   Stinging insects Avoid stinging insects. In case of an allergic reaction, take Benadryl 50 mg every 4 hours, and if life-threatening symptoms occur, inject with AuviQ 0.3 mg.  Call the clinic if this treatment plan is not working well for you  Follow up in 6 months or sooner if you would like to pursue food testing   Thank you so much for letting me partake in your care today.  Don't hesitate to reach out if you have any additional concerns!  ERoney Marion MD  [facility] [place]

## 2022-03-28 ENCOUNTER — Encounter: Payer: Self-pay | Admitting: Gastroenterology

## 2022-03-29 ENCOUNTER — Other Ambulatory Visit: Payer: Self-pay | Admitting: Internal Medicine

## 2022-04-17 DIAGNOSIS — D2261 Melanocytic nevi of right upper limb, including shoulder: Secondary | ICD-10-CM | POA: Diagnosis not present

## 2022-04-17 DIAGNOSIS — D2262 Melanocytic nevi of left upper limb, including shoulder: Secondary | ICD-10-CM | POA: Diagnosis not present

## 2022-04-17 DIAGNOSIS — D225 Melanocytic nevi of trunk: Secondary | ICD-10-CM | POA: Diagnosis not present

## 2022-04-17 DIAGNOSIS — L820 Inflamed seborrheic keratosis: Secondary | ICD-10-CM | POA: Diagnosis not present

## 2022-04-17 DIAGNOSIS — L821 Other seborrheic keratosis: Secondary | ICD-10-CM | POA: Diagnosis not present

## 2022-04-23 DIAGNOSIS — Z13820 Encounter for screening for osteoporosis: Secondary | ICD-10-CM | POA: Diagnosis not present

## 2022-05-23 ENCOUNTER — Other Ambulatory Visit (INDEPENDENT_AMBULATORY_CARE_PROVIDER_SITE_OTHER): Payer: BC Managed Care – PPO

## 2022-05-23 ENCOUNTER — Encounter: Payer: Self-pay | Admitting: Gastroenterology

## 2022-05-23 ENCOUNTER — Ambulatory Visit: Payer: BC Managed Care – PPO | Admitting: Gastroenterology

## 2022-05-23 VITALS — BP 110/68 | HR 87 | Ht 66.0 in | Wt 166.0 lb

## 2022-05-23 DIAGNOSIS — Z83719 Family history of colon polyps, unspecified: Secondary | ICD-10-CM

## 2022-05-23 DIAGNOSIS — Z9189 Other specified personal risk factors, not elsewhere classified: Secondary | ICD-10-CM | POA: Diagnosis not present

## 2022-05-23 DIAGNOSIS — R140 Abdominal distension (gaseous): Secondary | ICD-10-CM | POA: Diagnosis not present

## 2022-05-23 DIAGNOSIS — Z800 Family history of malignant neoplasm of digestive organs: Secondary | ICD-10-CM

## 2022-05-23 LAB — HEMOGLOBIN A1C: Hgb A1c MFr Bld: 5.8 % (ref 4.6–6.5)

## 2022-05-23 NOTE — Patient Instructions (Addendum)
Your provider has requested that you go to the basement level for lab work before leaving today. Press "B" on the elevator. The lab is located at the first door on the left as you exit the elevator.   We will call you to schedule EUS at the hospital. _______________________________________________________  If your blood pressure at your visit was 140/90 or greater, please contact your primary care physician to follow up on this.  _______________________________________________________  If you are age 55 or older, your body mass index should be between 23-30. Your Body mass index is 26.79 kg/m. If this is out of the aforementioned range listed, please consider follow up with your Primary Care Provider.  If you are age 16 or younger, your body mass index should be between 19-25. Your Body mass index is 26.79 kg/m. If this is out of the aformentioned range listed, please consider follow up with your Primary Care Provider.   __________________________________________________________  The [facility] providers would like to encourage you to use [facility] to communicate with providers for non-urgent requests or questions.  Due to long hold times on the telephone, sending your provider a message by [facility] may be a faster and more efficient way to get a response.  Please allow 48 business hours for a response.  Please remember that this is for non-urgent requests.    Due to recent changes in healthcare laws, you may see the results of your imaging and laboratory studies on MyChart before your provider has had a chance to review them.  We understand that in some cases there may be results that are confusing or concerning to you. Not all laboratory results come back in the same time frame and the provider may be waiting for multiple results in order to interpret others.  Please give [place] 48 hours in order for your provider to thoroughly review all the results before contacting the office for clarification of  your results.   Thank you for choosing me and [facility].  Vito Cirigliano, D.O.   Low-FODMAP Eating Plan  FODMAP stands for fermentable oligosaccharides, disaccharides, monosaccharides, and polyols. These are sugars that are hard for some people to digest. A low-FODMAP eating plan may help some people who have irritable bowel syndrome (IBS) and certain other bowel (intestinal) diseases to manage their symptoms. This meal plan can be complicated to follow. Work with a diet and nutrition specialist (dietitian) to make a low-FODMAP eating plan that is right for you. A dietitian can help make sure that you get enough nutrition from this diet. What are tips for following this plan? Reading food labels Check labels for hidden FODMAPs such as: High-fructose syrup. Honey. Agave. Natural fruit flavors. Onion or garlic powder. Choose low-FODMAP foods that contain 3-4 grams of fiber per serving. Check food labels for serving sizes. Eat only one serving at a time to make sure FODMAP levels stay low. Shopping Shop with a list of foods that are recommended on this diet and make a meal plan. Meal planning Follow a low-FODMAP eating plan for up to 6 weeks, or as told by your health care provider or dietitian. To follow the eating plan: Eliminate high-FODMAP foods from your diet completely. Choose only low-FODMAP foods to eat. You will do this for 2-6 weeks. Gradually reintroduce high-FODMAP foods into your diet one at a time. Most people should wait a few days before introducing the next new high-FODMAP food into their meal plan. Your dietitian can recommend how quickly you may reintroduce foods.  Keep a daily record of what and how much you eat and drink. Make note of any symptoms that you have after eating. Review your daily record with a dietitian regularly to identify which foods you can eat and which foods you should avoid. General tips Drink enough fluid each day to keep your urine  pale yellow. Avoid processed foods. These often have added sugar and may be high in FODMAPs. Avoid most dairy products, whole grains, and sweeteners. Work with a dietitian to make sure you get enough fiber in your diet. Avoid high FODMAP foods at meals to manage symptoms. Recommended foods Fruits Bananas, oranges, tangerines, lemons, limes, blueberries, raspberries, strawberries, grapes, cantaloupe, honeydew melon, kiwi, papaya, passion fruit, and pineapple. Limited amounts of dried cranberries, banana chips, and shredded coconut. Vegetables Eggplant, zucchini, cucumber, peppers, green beans, bean sprouts, lettuce, arugula, kale, Swiss chard, spinach, collard greens, bok choy, summer squash, potato, and tomato. Limited amounts of corn, carrot, and sweet potato. Green parts of scallions. Grains Gluten-free grains, such as rice, oats, buckwheat, quinoa, corn, polenta, and millet. Gluten-free pasta, bread, or cereal. Rice noodles. Corn tortillas. Meats and other proteins Unseasoned beef, pork, poultry, or fish. Eggs. Berniece Salines. Tofu (firm) and tempeh. Limited amounts of nuts and seeds, such as almonds, walnuts, [place] nuts, pecans, peanuts, nut butters, pumpkin seeds, chia seeds, and sunflower seeds. Dairy Lactose-free milk, yogurt, and kefir. Lactose-free cottage cheese and ice cream. Non-dairy milks, such as almond, coconut, hemp, and rice milk. Non-dairy yogurt. Limited amounts of goat cheese, brie, mozzarella, parmesan, swiss, and other hard cheeses. Fats and oils Butter-free spreads. Vegetable oils, such as olive, canola, and sunflower oil. Seasoning and other foods Artificial sweeteners with names that do not end in "ol," such as aspartame, saccharine, and stevia. Maple syrup, white table sugar, raw sugar, brown sugar, and molasses. Mayonnaise, soy sauce, and tamari. Fresh basil, coriander, parsley, rosemary, and thyme. Beverages Water and mineral water. Sugar-sweetened soft drinks. Small amounts  of orange juice or cranberry juice. Black and green tea. Most dry wines. Coffee. The items listed above may not be a complete list of foods and beverages you can eat. Contact a dietitian for more information. Foods to avoid Fruits Fresh, dried, and juiced forms of apple, pear, watermelon, peach, plum, cherries, apricots, blackberries, boysenberries, figs, nectarines, and mango. Avocado. Vegetables Chicory root, artichoke, asparagus, cabbage, snow peas, Brussels sprouts, broccoli, sugar snap peas, mushrooms, celery, and cauliflower. Onions, garlic, leeks, and the white part of scallions. Grains Wheat, including kamut, durum, and semolina. Barley and bulgur. Couscous. Wheat-based cereals. Wheat noodles, bread, crackers, and pastries. Meats and other proteins Fried or fatty meat. Sausage. Cashews and pistachios. Soybeans, baked beans, black beans, chickpeas, kidney beans, fava beans, navy beans, lentils, black-eyed peas, and split peas. Dairy Milk, yogurt, ice cream, and soft cheese. Cream and sour cream. Milk-based sauces. Custard. Buttermilk. Soy milk. Seasoning and other foods Any sugar-free gum or candy. Foods that contain artificial sweeteners such as sorbitol, mannitol, isomalt, or xylitol. Foods that contain honey, high-fructose corn syrup, or agave. Bouillon, vegetable stock, beef stock, and chicken stock. Garlic and onion powder. Condiments made with onion, such as hummus, chutney, pickles, relish, salad dressing, and salsa. Tomato paste. Beverages Chicory-based drinks. Coffee substitutes. Chamomile tea. Fennel tea. Sweet or fortified wines such as port or sherry. Diet soft drinks made with isomalt, mannitol, maltitol, sorbitol, or xylitol. Apple, pear, and mango juice. Juices with high-fructose corn syrup. The items listed above may not be a complete list of foods  and beverages you should avoid. Contact a dietitian for more information. Summary FODMAP stands for fermentable  oligosaccharides, disaccharides, monosaccharides, and polyols. These are sugars that are hard for some people to digest. A low-FODMAP eating plan is a short-term diet that helps to ease symptoms of certain bowel diseases. The eating plan usually lasts up to 6 weeks. After that, high-FODMAP foods are reintroduced gradually and one at a time. This can help you find out which foods may be causing symptoms. A low-FODMAP eating plan can be complicated. It is best to work with a dietitian who has experience with this type of plan. This information is not intended to replace advice given to you by your health care provider. Make sure you discuss any questions you have with your health care provider. Document Revised: 07/09/2019 Document Reviewed: 07/09/2019 Elsevier Patient Education  [place].

## 2022-05-23 NOTE — Progress Notes (Signed)
Chief Complaint:    Family history of Pancreatic Cancer, Pancreatic Cancer screening follow-up  GI History: Vanessa Goodman is a 57 y.o. 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 Her father was diagnosed with metastatic Pancreatic Cancer in 2021-06-11, and died within just 3 weeks of diagnosis.     Family history: - Mother: Breast cancer age 59, large colon polyp requiring surgical resection age 34 - Father: Pancreatic cancer age 6 - Sister: Breast cancer age 52 - Paternal aunt: Pancreatic cancer age 52 - PGF: Lung cancer - Maternal cousin: Breast cancer in her 37s  Genetic Testing results from June 11, 2021: No pathogenic mutations  Pancreatic Cancer screening protocol: Up-to-date with most recent studies as below - Initiated screening in 07/2021 - 07/06/2021: Hgb A1c 5.9% - 07/25/2021: MRI/MRCP: Normal - Scheduling EUS today  2) Family history of advanced colon polyps   Endoscopic History: - Colonoscopy (07/2014, [place]): Normal per patient. No report available for review, but recommended repeat in 5 years d/t Fhx - Colonoscopy (08/2021, Dr. Bryan Lemma): Moderate stool throughout with fair prep.  Normal visualized colon.  Normal TI.  Repeat in 2 years due to suboptimal bowel preparation.  HPI:     Patient is a 57 y.o. female presenting to the Gastroenterology Clinic for follow-up.  Was initially seen by me in 07/2021 to initiate PC screening protocol based on familial kindred guidelines.  Completed MRI/MRCP in 07/2021 as above.  She does occasionally have some postprandial bloating and generalized discomfort.  Not pain.  No change in bowel habits, nausea, vomiting.  No overt bleeding.   Feels otherwise well and in her usual state of health today.    Review of  systems:     No chest pain, no SOB, no fevers, no urinary sx   Past Medical History:  Diagnosis Date   Asthma    Breast cancer ([place])    dx at 21   Recurrent upper respiratory infection (URI)     Patient's surgical history, family medical history, social history, medications and allergies were all reviewed in Epic    Current Outpatient Medications  Medication Sig Dispense Refill   albuterol (PROAIR HFA) 108 (90 Base) MCG/ACT inhaler 2 puffs every 4 hours if needed for wheezing or coughing spells. 1 each 3   Cetirizine HCl 10 MG CAPS 1 capsule daily in the afternoon.     EPINEPHrine 0.3 mg/0.3 mL IJ SOAJ injection Inject 0.3 mg into the muscle as needed for anaphylaxis. 1 each 1   fluticasone (FLONASE) 50 MCG/ACT nasal spray 2 sprays per nostril once a day if needed for stuffy nose. (Patient taking differently: as needed. 2 sprays per nostril once a day if needed for stuffy nose.) 16 g 5   fluticasone-salmeterol (ADVAIR HFA) 45-21 MCG/ACT inhaler Inhale 2 puffs into the lungs 2 (two) times daily. 1 each 12   montelukast (SINGULAIR) 10 MG tablet TAKE 1 TABLET(10 MG) BY MOUTH AT BEDTIME 90 tablet 0   triamcinolone ointment (KENALOG) 0.1 % Apply 1 Application topically 2 (two) times daily. 30 g 0   No current facility-administered medications for this visit.    Physical Exam:     Ht 5\' 6"  (1.676 m)   Wt 166 lb (75.3 kg)   BMI 26.79 kg/m   GENERAL:  Pleasant  female in NAD PSYCH: : Cooperative, normal affect NEURO: Alert and oriented x 3, no focal neurologic deficits   IMPRESSION and PLAN:    1) Family History of Pancreatic Cancer 2) Personal Hx of Breast CA Continue PC screening protocol per familial kindred guidelines - Schedule EUS - If EUS normal/unremarkable, plan for MRI/MRCP in 1 year and continue annual screening in alternating fashion between imaging and EUS - Check hemoglobin A1c - Provided EUS, labs, imaging all unremarkable, continue annual follow-up in the  office  3) Family history of advanced colon polyps Colonoscopy 2023 with suboptimal bowel prep - Repeat colonoscopy in 07/2023  4) Abdominal bloating - Low FODMAP diet.  Provided with handout and detailed instructions today  RTC in 1 year or sooner as needed         Lavena Bullion ,DO, FACG 05/23/2022, 3:46 PM

## 2022-05-24 ENCOUNTER — Telehealth: Payer: Self-pay

## 2022-05-24 ENCOUNTER — Other Ambulatory Visit: Payer: Self-pay

## 2022-05-24 DIAGNOSIS — Z9189 Other specified personal risk factors, not elsewhere classified: Secondary | ICD-10-CM

## 2022-05-24 DIAGNOSIS — Z800 Family history of malignant neoplasm of digestive organs: Secondary | ICD-10-CM

## 2022-05-24 DIAGNOSIS — Z83719 Family history of colon polyps, unspecified: Secondary | ICD-10-CM

## 2022-05-24 NOTE — Telephone Encounter (Signed)
Dr Rush Landmark - please also see Dr Bryan Lemma  05/23/22 visit note.   Patient has been scheduled for EUS 07/09/22 at [facility] procedure time 10:15 am , arrival time 8:45 am. Instructions will be sent to patient via my chart and mailed to her. Pt voiced understanding.

## 2022-05-25 NOTE — Telephone Encounter (Signed)
Thanks for update. GM 

## 2022-06-26 ENCOUNTER — Other Ambulatory Visit: Payer: Self-pay | Admitting: Internal Medicine

## 2022-06-27 IMAGING — MR MR ABDOMEN WO/W CM MRCP
19 of 22 series · 44 of 48 positions shown · IV contrast (gadavist)
Comparison: None Available.

CLINICAL DATA: Pancreatic cancer screening, family history of
pancreatic cancer history of breast cancer status post bilateral
mastectomies. View precaval

EXAM:
MRI ABDOMEN WITHOUT AND WITH CONTRAST (INCLUDING MRCP)
TECHNIQUE: Multiplanar multisequence MR imaging of the abdomen was performed
both before and after the administration of intravenous contrast.
Heavily T2-weighted images of the biliary and pancreatic ducts were
obtained, and three-dimensional MRCP images were rendered by post
processing.
CONTRAST:  7mL GADAVIST GADOBUTROL 1 MMOL/ML IV SOLN

[Series 2: DWI · axial · 6.0mm · 1.49mm/px · z∈[-146,+149]mm · 2 of 84 slices shown (1 of 2)]
[im 1/84]
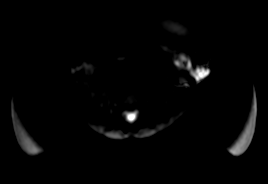
[im 84/84]
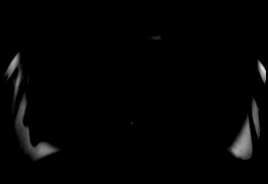

[Series 3: DWI · axial · 6.0mm · 1.49mm/px · 1 of 42 slices shown (2 of 2)]
[im 1/42]
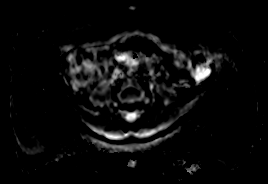

[Series 5: T2 fat-sat · axial · 6.0mm · 1.25mm/px · 1 of 42 slices shown]
[im 1/42]
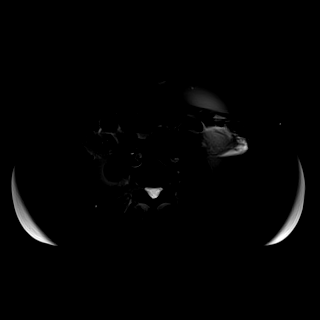

[Series 7: T2 · coronal · 6.0mm · 1.48mm/px · 1 of 32 slices shown (1 of 2)]
[im 1/32]
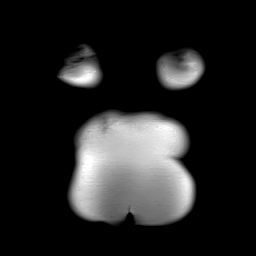

[Series 8: T1 · axial · 3.0mm · 1.25mm/px · z∈[-142,+142]mm · 3 of 96 slices shown (1 of 2)]
[im 1/96]
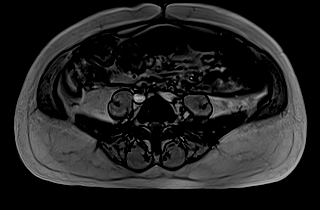
[im 48/96]
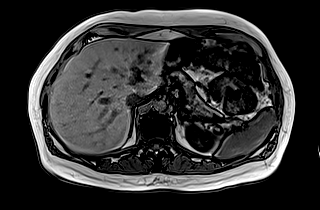
[im 96/96]
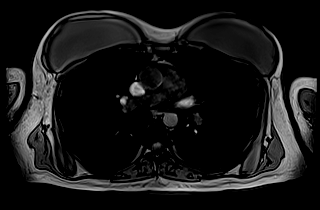

[Series 9: T1 · axial · 3.0mm · 1.25mm/px · z∈[-142,+142]mm · 3 of 96 slices shown (2 of 2)]
[im 1/96]
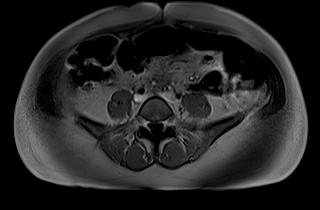
[im 48/96]
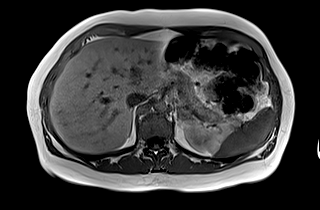
[im 96/96]
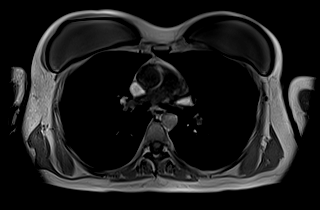

[Series 11: T2 · axial · 6.0mm · 1.48mm/px · 1 of 40 slices shown (2 of 2)]
[im 1/40]
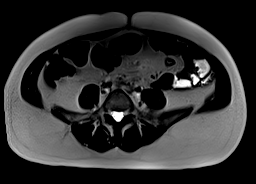

[Series 12: cor obl thk · sagittal · 50.0mm · 0.78mm/px · 1 of 9 slices shown]
[im 1/9]
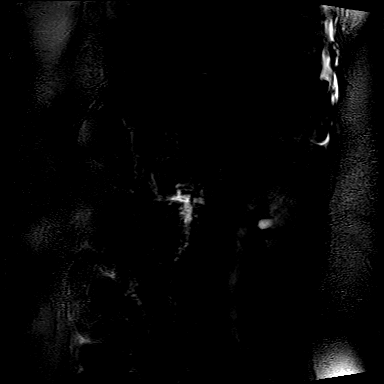

[Series 14: cor_3d_spc_trig · coronal · 1.0mm · 0.49mm/px · 2 of 72 slices shown]
[im 1/72]
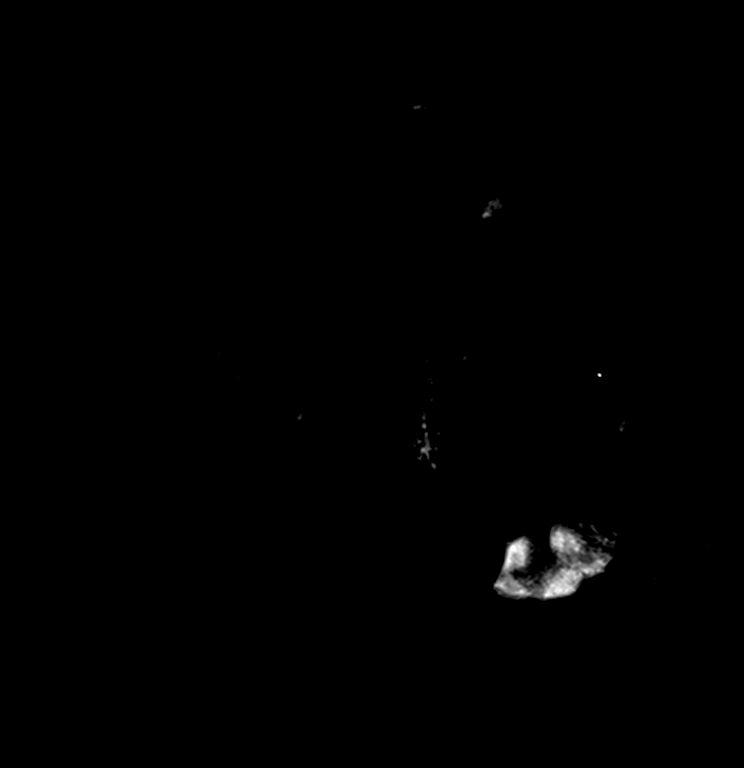
[im 72/72]
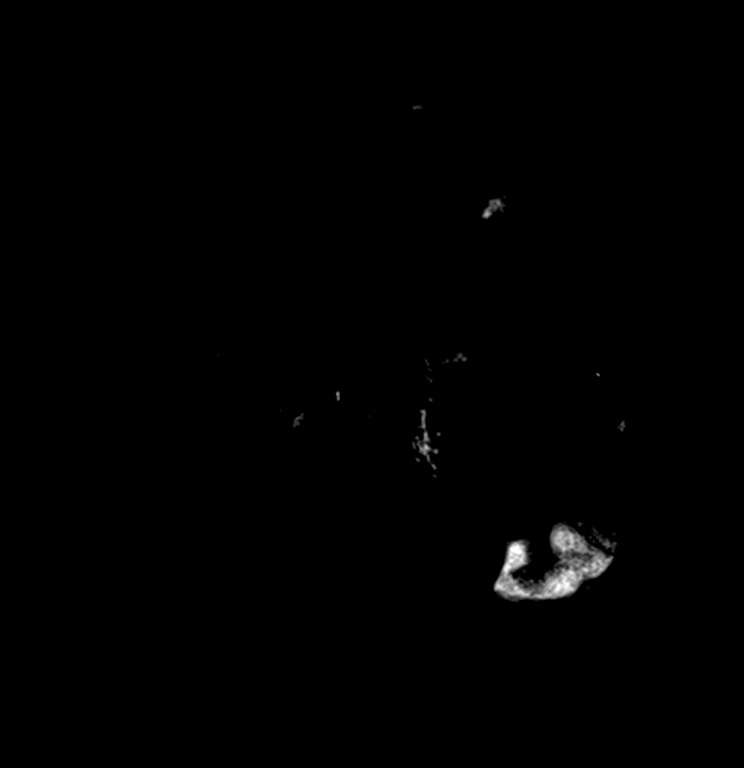

[Series 17: T1 dynamic · axial · 3.0mm · 1.19mm/px · z∈[-185,+100]mm · 3 of 96 slices shown (1 of 10)]
[im 1/96]
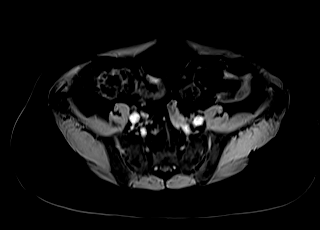
[im 48/96]
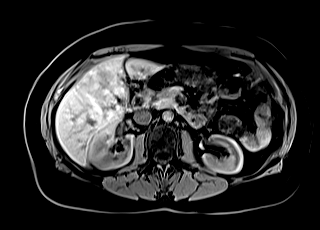
[im 96/96]
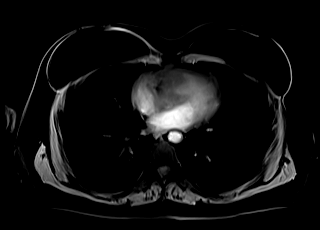

[Series 21: T1 dynamic · axial · 3.0mm · 1.19mm/px · z∈[-185,+100]mm · 3 of 96 slices shown (2 of 10)]
[im 1/96]
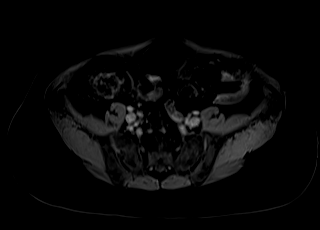
[im 48/96]
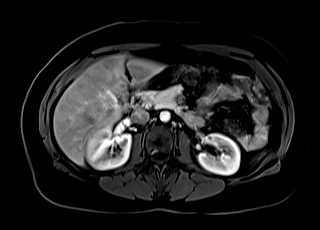
[im 96/96]
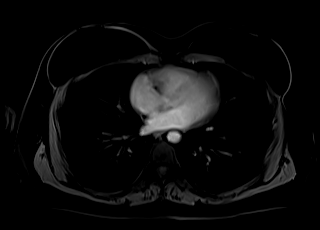

[Series 22: T1 dynamic · axial · 3.0mm · 1.19mm/px · z∈[-185,+100]mm · 3 of 96 slices shown (3 of 10)]
[im 1/96]
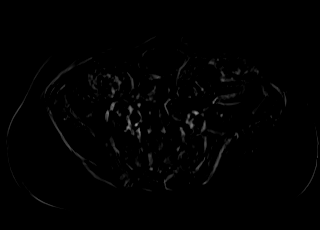
[im 48/96]
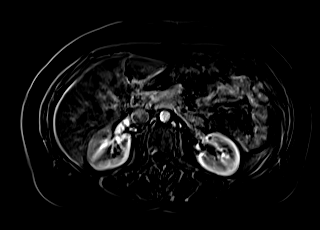
[im 96/96]
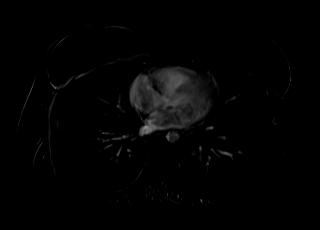

[Series 26: T1 dynamic · axial · 3.0mm · 1.19mm/px · z∈[-185,+100]mm · 3 of 96 slices shown (4 of 10)]
[im 1/96]
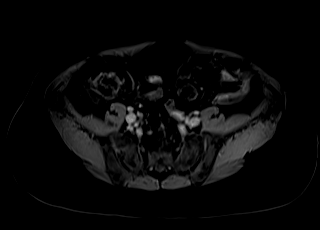
[im 48/96]
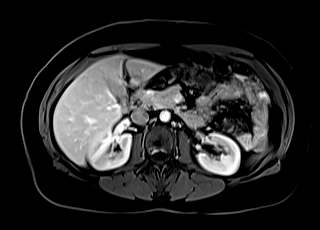
[im 96/96]
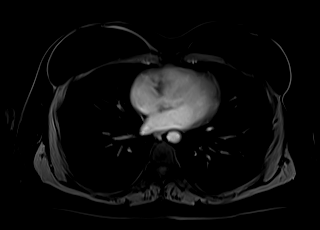

[Series 27: T1 dynamic · axial · 3.0mm · 1.19mm/px · z∈[-185,+100]mm · 3 of 96 slices shown (5 of 10)]
[im 1/96]
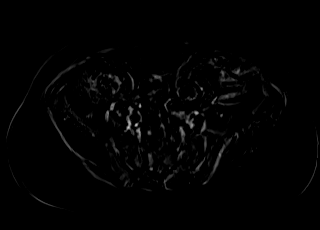
[im 48/96]
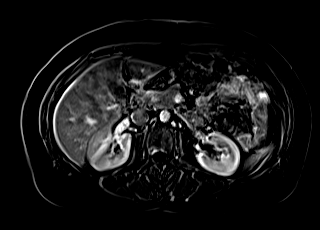
[im 96/96]
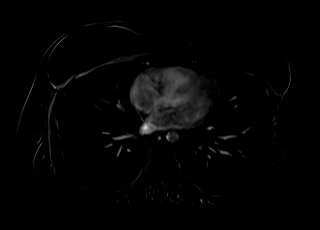

[Series 30: T1 dynamic · axial · 3.0mm · 1.19mm/px · z∈[-185,+100]mm · 3 of 96 slices shown (6 of 10)]
[im 1/96]
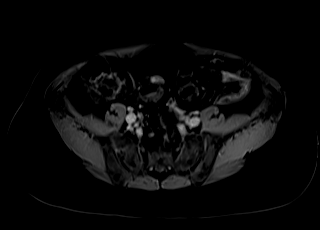
[im 48/96]
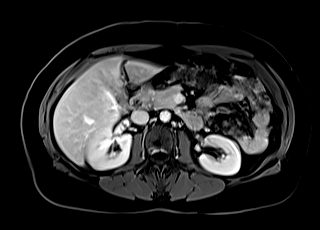
[im 96/96]
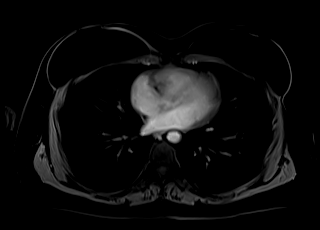

[Series 31: T1 dynamic · axial · 3.0mm · 1.19mm/px · z∈[-185,+100]mm · 3 of 96 slices shown (7 of 10)]
[im 1/96]
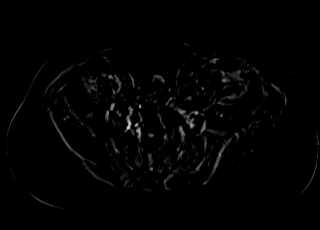
[im 48/96]
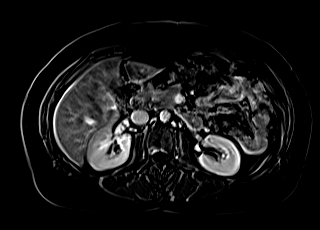
[im 96/96]
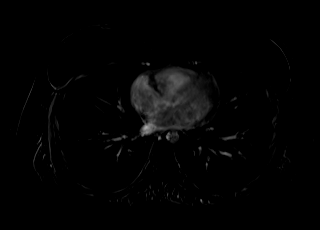

[Series 33: T1 dynamic · coronal · 4.0mm · 1.25mm/px · 2 of 64 slices shown (8 of 10)]
[im 1/64]
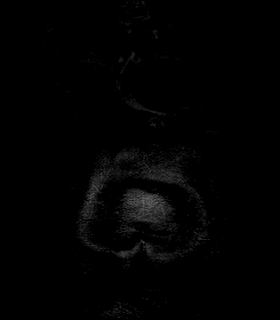
[im 64/64]
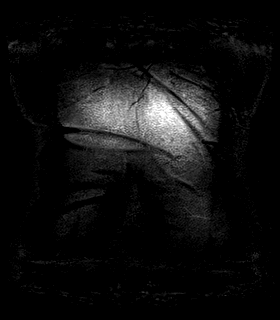

[Series 36: T1 dynamic · axial · 3.0mm · 1.19mm/px · z∈[-185,+100]mm · 3 of 96 slices shown (9 of 10)]
[im 1/96]
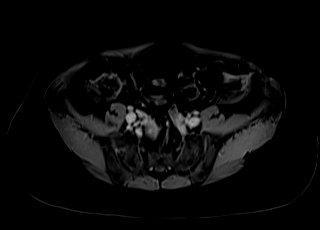
[im 48/96]
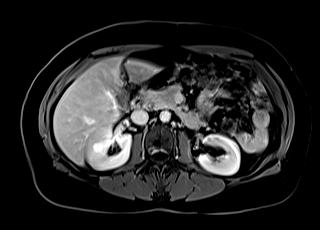
[im 96/96]
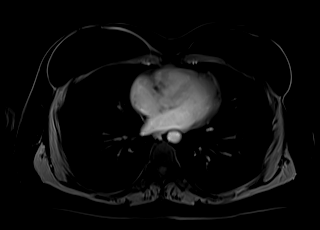

[Series 37: T1 dynamic · axial · 3.0mm · 1.19mm/px · z∈[-185,+100]mm · 3 of 96 slices shown (10 of 10)]
[im 1/96]
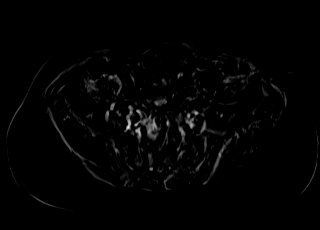
[im 48/96]
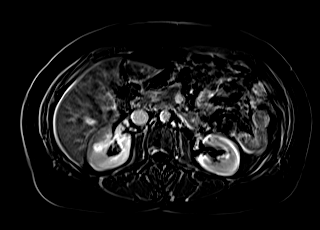
[im 96/96]
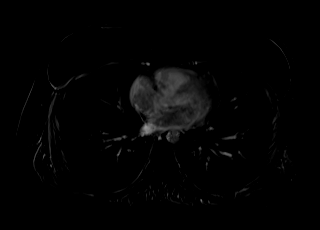

[44 of 48 positions shown; findings below may reference images not displayed]

FINDINGS: Lower chest: No acute abnormality. Partially visualized bilateral
breast prostheses.

Hepatobiliary: No significant hepatic steatosis. No suspicious
hepatic lesion. Gallbladder is unremarkable. No biliary ductal
dilation.

Pancreas: Intrinsic T1 signal of the pancreatic parenchyma is within
normal limits. Homogeneous enhancement of the pancreas postcontrast
administration. No pancreatic ductal dilation or evidence of acute
inflammation. No cystic or solid hyperenhancing pancreatic lesion.

Spleen:  Within normal limits in size and appearance.

Adrenals/Urinary Tract: Bilateral adrenal glands appear normal. No
hydronephrosis. No suspicious renal mass.

Stomach/Bowel: Limited evaluation reveals no acute abnormality.

Vascular/Lymphatic: Normal caliber abdominal aorta. No
pathologically enlarged abdominal lymph nodes.

Other:  No significant abdominal free fluid.

Musculoskeletal: No suspicious bone lesions identified.
IMPRESSION: No pancreatic mass identified and no pancreatic ductal dilation.

## 2022-07-02 ENCOUNTER — Encounter (HOSPITAL_COMMUNITY): Payer: Self-pay | Admitting: Gastroenterology

## 2022-07-02 NOTE — Progress Notes (Signed)
Attempted to obtain medical history via telephone, unable to reach at this time. HIPAA compliant voicemail message left requesting return call to pre surgical testing department. 

## 2022-07-04 ENCOUNTER — Telehealth: Payer: Self-pay | Admitting: Gastroenterology

## 2022-07-04 NOTE — Telephone Encounter (Signed)
Patient called stating that she needs to cancel her procedure that was scheduled on 05/06 at [facility] due to finding out recently her son will be having surgery on the same day. Would like to reschedule. Please advise, thank you.

## 2022-07-05 NOTE — Telephone Encounter (Signed)
The pt has been contacted and appt rescheduled to 8-26 at 8 am. She has been re instructed and all questions answered

## 2022-07-06 ENCOUNTER — Telehealth: Payer: Self-pay | Admitting: Gastroenterology

## 2022-07-06 NOTE — Telephone Encounter (Signed)
PT has an EUS scheudled for 8/26 and she wants to have an earlier appointment

## 2022-07-06 NOTE — Telephone Encounter (Signed)
The pt has been advised that there are no sooner appts available.  She will call and check periodically to see if we have any cancellations.

## 2022-09-18 DIAGNOSIS — Z780 Asymptomatic menopausal state: Secondary | ICD-10-CM | POA: Diagnosis not present

## 2022-09-18 DIAGNOSIS — R42 Dizziness and giddiness: Secondary | ICD-10-CM | POA: Diagnosis not present

## 2022-09-24 ENCOUNTER — Other Ambulatory Visit: Payer: Self-pay | Admitting: Internal Medicine

## 2022-10-05 ENCOUNTER — Other Ambulatory Visit: Payer: Self-pay | Admitting: Internal Medicine

## 2022-10-22 NOTE — Progress Notes (Signed)
Attempted to obtain medical history via telephone, unable to reach at this time. HIPAA compliant voicemail message left requesting return call to pre surgical testing department. 

## 2022-10-29 ENCOUNTER — Other Ambulatory Visit: Payer: Self-pay

## 2022-10-29 ENCOUNTER — Encounter (HOSPITAL_COMMUNITY): Payer: Self-pay | Admitting: Gastroenterology

## 2022-10-29 ENCOUNTER — Ambulatory Visit (HOSPITAL_COMMUNITY): Payer: BC Managed Care – PPO

## 2022-10-29 ENCOUNTER — Ambulatory Visit (HOSPITAL_COMMUNITY)
Admission: RE | Admit: 2022-10-29 | Discharge: 2022-10-29 | Disposition: A | Discharge status: Home / Self Care | Payer: BC Managed Care – PPO | Attending: Gastroenterology | Admitting: Gastroenterology

## 2022-10-29 ENCOUNTER — Telehealth: Payer: Self-pay

## 2022-10-29 ENCOUNTER — Encounter (HOSPITAL_COMMUNITY)
Admission: RE | Disposition: A | Discharge status: Home / Self Care | Payer: Self-pay | Source: Home / Self Care | Attending: Gastroenterology

## 2022-10-29 DIAGNOSIS — K2950 Unspecified chronic gastritis without bleeding: Secondary | ICD-10-CM | POA: Diagnosis not present

## 2022-10-29 DIAGNOSIS — K2970 Gastritis, unspecified, without bleeding: Secondary | ICD-10-CM

## 2022-10-29 DIAGNOSIS — Z9013 Acquired absence of bilateral breasts and nipples: Secondary | ICD-10-CM | POA: Insufficient documentation

## 2022-10-29 DIAGNOSIS — Z801 Family history of malignant neoplasm of trachea, bronchus and lung: Secondary | ICD-10-CM | POA: Diagnosis not present

## 2022-10-29 DIAGNOSIS — R932 Abnormal findings on diagnostic imaging of liver and biliary tract: Secondary | ICD-10-CM | POA: Insufficient documentation

## 2022-10-29 DIAGNOSIS — K3189 Other diseases of stomach and duodenum: Secondary | ICD-10-CM | POA: Diagnosis not present

## 2022-10-29 DIAGNOSIS — Z1289 Encounter for screening for malignant neoplasm of other sites: Secondary | ICD-10-CM | POA: Insufficient documentation

## 2022-10-29 DIAGNOSIS — Z83719 Family history of colon polyps, unspecified: Secondary | ICD-10-CM

## 2022-10-29 DIAGNOSIS — K2289 Other specified disease of esophagus: Secondary | ICD-10-CM | POA: Diagnosis not present

## 2022-10-29 DIAGNOSIS — Z853 Personal history of malignant neoplasm of breast: Secondary | ICD-10-CM | POA: Insufficient documentation

## 2022-10-29 DIAGNOSIS — R933 Abnormal findings on diagnostic imaging of other parts of digestive tract: Secondary | ICD-10-CM

## 2022-10-29 DIAGNOSIS — K449 Diaphragmatic hernia without obstruction or gangrene: Secondary | ICD-10-CM | POA: Insufficient documentation

## 2022-10-29 DIAGNOSIS — Z803 Family history of malignant neoplasm of breast: Secondary | ICD-10-CM | POA: Diagnosis not present

## 2022-10-29 DIAGNOSIS — Z800 Family history of malignant neoplasm of digestive organs: Secondary | ICD-10-CM | POA: Diagnosis not present

## 2022-10-29 DIAGNOSIS — K319 Disease of stomach and duodenum, unspecified: Secondary | ICD-10-CM | POA: Diagnosis not present

## 2022-10-29 HISTORY — PX: UPPER ESOPHAGEAL ENDOSCOPIC ULTRASOUND (EUS): SHX6562

## 2022-10-29 HISTORY — PX: BIOPSY: SHX5522

## 2022-10-29 HISTORY — PX: ESOPHAGOGASTRODUODENOSCOPY: SHX5428

## 2022-10-29 SURGERY — UPPER ESOPHAGEAL ENDOSCOPIC ULTRASOUND (EUS)
Anesthesia: Monitor Anesthesia Care

## 2022-10-29 MED ORDER — LACTATED RINGERS IV SOLN
INTRAVENOUS | Status: AC | PRN
  Administered 2022-10-29: 1000 mL via INTRAVENOUS

## 2022-10-29 MED ORDER — PROPOFOL 10 MG/ML IV BOLUS
INTRAVENOUS | Status: DC | PRN
  Administered 2022-10-29: 50 mg via INTRAVENOUS
  Administered 2022-10-29: 30 mg via INTRAVENOUS
  Administered 2022-10-29: 20 mg via INTRAVENOUS

## 2022-10-29 MED ORDER — PROPOFOL 10 MG/ML IV BOLUS
INTRAVENOUS | Status: AC
  Filled 2022-10-29: qty 20

## 2022-10-29 MED ORDER — PROPOFOL 500 MG/50ML IV EMUL
INTRAVENOUS | Status: DC | PRN
  Administered 2022-10-29: 120 ug/kg/min via INTRAVENOUS

## 2022-10-29 MED ORDER — ONDANSETRON HCL 4 MG/2ML IJ SOLN
INTRAMUSCULAR | Status: DC | PRN
  Administered 2022-10-29: 4 mg via INTRAVENOUS

## 2022-10-29 MED ORDER — PHENYLEPHRINE 80 MCG/ML (10ML) SYRINGE FOR IV PUSH (FOR BLOOD PRESSURE SUPPORT)
PREFILLED_SYRINGE | INTRAVENOUS | Status: DC | PRN
  Administered 2022-10-29 (×2): 80 ug via INTRAVENOUS

## 2022-10-29 MED ORDER — LIDOCAINE 2% (20 MG/ML) 5 ML SYRINGE
INTRAMUSCULAR | Status: DC | PRN
  Administered 2022-10-29: 100 mg via INTRAVENOUS

## 2022-10-29 MED ORDER — SODIUM CHLORIDE 0.9 % IV SOLN: INTRAVENOUS | Status: DC

## 2022-10-29 MED ORDER — PROPOFOL 500 MG/50ML IV EMUL
INTRAVENOUS | Status: AC
  Filled 2022-10-29: qty 50

## 2022-10-29 NOTE — Anesthesia Procedure Notes (Signed)
Procedure Name: MAC Date/Time: 10/29/2022 8:01 AM  Performed by: Maurene Capes, CRNAPre-anesthesia Checklist: Patient identified, Emergency Drugs available, Suction available and Patient being monitored Patient Re-evaluated:Patient Re-evaluated prior to induction Oxygen Delivery Method: Simple face mask Induction Type: IV induction Placement Confirmation: positive ETCO2 Dental Injury: Teeth and Oropharynx as per pre-operative assessment

## 2022-10-29 NOTE — Anesthesia Preprocedure Evaluation (Addendum)
Anesthesia Evaluation  Patient identified by MRN, date of birth, ID band Patient awake    Reviewed: Allergy & Precautions, NPO status , Patient's Chart, lab work & pertinent test results  Airway Mallampati: I       Dental no notable dental hx.    Pulmonary asthma    Pulmonary exam normal        Cardiovascular negative cardio ROS Normal cardiovascular exam     Neuro/Psych negative neurological ROS  negative psych ROS   GI/Hepatic negative GI ROS, Neg liver ROS,,,  Endo/Other  negative endocrine ROS    Renal/GU negative Renal ROS  negative genitourinary   Musculoskeletal negative musculoskeletal ROS (+)    Abdominal Normal abdominal exam  (+)   Peds  Hematology negative hematology ROS (+)   Anesthesia Other Findings   Reproductive/Obstetrics                             Anesthesia Physical Anesthesia Plan  ASA: 2  Anesthesia Plan: MAC   Post-op Pain Management:    Induction:   PONV Risk Score and Plan: 2  Airway Management Planned: Natural Airway and Nasal ETT  Additional Equipment: None  Intra-op Plan:   Post-operative Plan:   Informed Consent: I have reviewed the patients History and Physical, chart, labs and discussed the procedure including the risks, benefits and alternatives for the proposed anesthesia with the patient or authorized representative who has indicated his/her understanding and acceptance.       Plan Discussed with: CRNA  Anesthesia Plan Comments:        Anesthesia Quick Evaluation

## 2022-10-29 NOTE — Discharge Instructions (Signed)

## 2022-10-29 NOTE — Op Note (Signed)
[facility] Patient Name: Vanessa Goodman Procedure Date: 10/29/2022 MRN: [id number] Attending MD: Corliss Parish , MD, [id number] Date of Birth: [birth year] CSN: [id number] Age: 57 Admit Type: Outpatient Procedure:                Upper EUS Indications:              Initial EUS exam for pancreatic cancer screening in                            high-risk patient, Family history of pancreatic                            ductal adenocarcinoma in one first-degree relative,                            Family history of pancreatic ductal adenocarcinoma                            in other relative, Family history of other cancer Providers:                Corliss Parish, MD, Norman Clay, RN, Leanne Lovely, Technician Referring MD:             Doristine Locks, MD, Sherie Don. Henderson Medicines:                Monitored Anesthesia Care Complications:            No immediate complications. Estimated Blood Loss:     Estimated blood loss was minimal. Procedure:                Pre-Anesthesia Assessment:                           - Prior to the procedure, a History and Physical                            was performed, and patient medications and                            allergies were reviewed. The patient's tolerance of                            previous anesthesia was also reviewed. The risks                            and benefits of the procedure and the sedation                            options and risks were discussed with the patient.                            All questions were answered, and informed consent  was obtained. Prior Anticoagulants: The patient has                            taken no anticoagulant or antiplatelet agents. ASA                            Grade Assessment: II - A patient with mild systemic                            disease. After reviewing the risks and benefits,                             the patient was deemed in satisfactory condition to                            undergo the procedure.                           After obtaining informed consent, the endoscope was                            passed under direct vision. Throughout the                            procedure, the patient's blood pressure, pulse, and                            oxygen saturations were monitored continuously. The                            [id number]-[id number] ([id number]) Olympus endoscope was introduced                            through the mouth, and advanced to the second part                            of duodenum. The Linear GF- [id number] ) was                            introduced through the mouth, and advanced to the                            duodenum for ultrasound examination from the                            stomach and duodenum. The upper EUS was                            accomplished without difficulty. The patient                            tolerated the procedure. Scope In: Scope Out: Findings:      ENDOSCOPIC FINDING: :      No gross  lesions were noted in the entire esophagus.      The Z-line was irregular and was found 35 cm from the incisors.      A 2 cm hiatal hernia was present.      A J-shaped deformity was found of the gastric body.      Striped mildly erythematous mucosa without bleeding was found in the       gastric antrum.      No other gross lesions were noted in the entire examined stomach.       Biopsies were taken with a cold forceps for histology and Helicobacter       pylori testing.      No gross lesions were noted in the duodenal bulb, in the first portion       of the duodenum and in the second portion of the duodenum.      The major papilla was normal in appearance.      ENDOSONOGRAPHIC FINDING: :      There was no sign of significant endosonographic abnormality in the       pancreatic head (PD = 1.4 mm), genu of the pancreas (PD = 1.9 mm),        pancreatic body (PD = 1.5 mm), pancreatic tail (PD = 1.3 mm) and       uncinate process of the pancreas. No masses, no cysts, no       calcifications, the pancreatic duct was thin in caliber and although it       does not develop into a true ansa loop there is angulation noted within       the head/neck region.      There was no sign of significant endosonographic abnormality in the       common bile duct 2.6 mm) and in the common hepatic duct (3.3 mm). No       stones and ducts with regular contour were identified.      Endosonographic imaging of the ampulla showed no intramural       (subepithelial) lesion.      There is a focal hyperechoic echotexture in the visualized portion of       the left lobe liver (12 mm x 10 mm).      Endosonographic imaging in the visualized portion of the liver showed no       other masses/lesions.      No malignant-appearing lymph nodes were visualized in the celiac region       (level 20), peripancreatic region and porta hepatis region.      The celiac region was visualized. Impression:               EGD impression:                           - No gross lesions in the entire esophagus. Z-line                            irregular, 35 cm from the incisors.                           - 2 cm hiatal hernia.                           - J-shaped stomach.                           -  Erythematous mucosa in the antrum. No other gross                            lesions in the entire stomach. Biopsied.                           - No gross lesions in the duodenal bulb, in the                            first portion of the duodenum and in the second                            portion of the duodenum.                           - Normal major papilla.                           EUS impression:                           - There was no sign of significant pathology in the                            pancreatic head, genu of the pancreas, pancreatic                             body, pancreatic tail and uncinate process of the                            pancreas. The pancreas duct has some angulation                            within the head/neck region but no evidence of ansa                            loop.                           - There was no sign of significant pathology in the                            common bile duct and in the common hepatic duct.                           - There was focal hyperechoic echotexture in the                            visualized portion of the left lobe of the liver                            (12 mm x 10 mm). Query if this is focal fatty  deposition versus focal nodular hyperplasia (it is                            not hypoechoic in nature and thus was not sampled).                           - No malignant-appearing lymph nodes were                            visualized in the celiac region (level 20),                            peripancreatic region and porta hepatis region. Moderate Sedation:      Not Applicable - Patient had care per Anesthesia. Recommendation:           - The patient will be observed post-procedure,                            until all discharge criteria are met.                           - Discharge patient to home.                           - Patient has a contact number available for                            emergencies. The signs and symptoms of potential                            delayed complications were discussed with the                            patient. Return to normal activities tomorrow.                            Written discharge instructions were provided to the                            patient.                           - Resume previous diet.                           - Observe patient's clinical course.                           - Continue present medications.                           - Will discuss with primary gastroenterologist to                             consider liver protocol CT abdomen to further  evaluate this area of potential focal fat versus                            other etiology nonurgent.                           - Repeat MRI/MRCP for high risk pancreas cancer                            screening cohort in 1 year. Pending all is well                            then plan will be for repeat the upper endoscopic                            ultrasound 1 year thereafter.                           - The findings and recommendations were discussed                            with the patient.                           - The findings and recommendations were discussed                            with the patient's family. Procedure Code(s):        --- Professional ---                           [phone], Esophagogastroduodenoscopy, flexible,                            transoral; with endoscopic ultrasound examination                            limited to the esophagus, stomach or duodenum, and                            adjacent structures                           43239, Esophagogastroduodenoscopy, flexible,                            transoral; with biopsy, single or multiple Diagnosis Code(s):        --- Professional ---                           K22.89, Other specified disease of esophagus                           K44.9, Diaphragmatic hernia without obstruction or  gangrene                           K31.89, Other diseases of stomach and duodenum                           I89.9, Noninfective disorder of lymphatic vessels                            and lymph nodes, unspecified                           Z12.89, Encounter for screening for malignant                            neoplasm of other sites                           Z80.0, Family history of malignant neoplasm of                            digestive organs                           Z80.8, Family history of  malignant neoplasm of                            other organs or systems                           R93.2, Abnormal findings on diagnostic imaging of                            liver and biliary tract CPT copyright 2022 [employer]. All rights reserved. The codes documented in this report are preliminary and upon coder review may  be revised to meet current compliance requirements. Corliss Parish, MD 10/29/2022 8:47:00 AM Number of Addenda: 0

## 2022-10-29 NOTE — Telephone Encounter (Signed)
Patient has been scheduled for CT abdomen w/wo-3 phase liver protocol at [facility] on Friday, 11/30/22 at 830 am, 800 am arrival. NPO 4 hours prior to her test. I will contact patient tomorrow with this information as she was sedated today for EUS.  In addition, MRI/MRCP reminder has been placed in EPIC for 1 year interval. HgB A1C and office follow up reminders placed for 04/2023.

## 2022-10-29 NOTE — H&P (Signed)
GASTROENTEROLOGY PROCEDURE H&P NOTE   Primary Care Physician: Emilio Aspen, MD  HPI: Vanessa Goodman is a 57 y.o. female who presents for EGD/EUS to evaluate pancretic cancer screening high risk cohort.  Past Medical History:  Diagnosis Date   Asthma    Breast cancer (HCC)    dx at 74   Recurrent upper respiratory infection (URI)    Past Surgical History:  Procedure Laterality Date   COLONOSCOPY     [facility] thinks around age 26-50. Normal   MASTECTOMY Bilateral    Current Facility-Administered Medications  Medication Dose Route Frequency Provider Last Rate Last Admin   0.9 %  sodium chloride infusion   Intravenous Continuous Mansouraty, Netty Starring., MD       lactated ringers infusion    Continuous PRN Mansouraty, Netty Starring., MD 10 mL/hr at 10/29/22 0716 1,000 mL at 10/29/22 0716    Current Facility-Administered Medications:    0.9 %  sodium chloride infusion, , Intravenous, Continuous, Mansouraty, Netty Starring., MD   lactated ringers infusion, , , Continuous PRN, Mansouraty, Netty Starring., MD, Last Rate: 10 mL/hr at 10/29/22 0716, 1,000 mL at 10/29/22 0716 Allergies  Allergen Reactions   Bee Venom Hives and Rash   Family History  Problem Relation Age of Onset   Breast cancer Mother 39   Colon polyps Mother        said 84-7in of colon removed due to polyps   Asthma Father    Allergic rhinitis Father    Pancreatic cancer Father 43   Breast cancer Sister 52   Asthma Maternal Grandmother    Lung cancer Paternal Grandfather    Pancreatic cancer Paternal Aunt 17   Breast cancer Cousin        mom's neice dx 40's   Eczema Neg Hx    Urticaria Neg Hx    Esophageal cancer Neg Hx    Social History   Socioeconomic History   Marital status: Married    Spouse name: Not on file   Number of children: Not on file   Years of education: Not on file   Highest education level: Not on file  Occupational History   Not on file  Tobacco Use   Smoking status: Never    Smokeless tobacco: Never  Vaping Use   Vaping status: Never Used  Substance and Sexual Activity   Alcohol use: Yes    Comment: rare   Drug use: Never   Sexual activity: Not on file  Other Topics Concern   Not on file  Social History Narrative   Not on file   Social Determinants of Health   Financial Resource Strain: Not on file  Food Insecurity: Not on file  Transportation Needs: Not on file  Physical Activity: Not on file  Stress: Not on file  Social Connections: Not on file  Intimate Partner Violence: Not on file    Physical Exam: Today's Vitals   10/29/22 0659  BP: (!) 120/59  Pulse: (!) 58  Resp: 16  Temp: 97.8 F (36.6 C)  TempSrc: Tympanic  SpO2: 100%  Weight: 75.3 kg  Height: 5\' 6"  (1.676 m)  PainSc: 0-No pain   Body mass index is 26.79 kg/m. GEN: NAD EYE: Sclerae anicteric ENT: MMM CV: Non-tachycardic GI: Soft, NT/ND NEURO:  Alert & Oriented x 3  Lab Results: No results for input(s): "WBC", "HGB", "HCT", "PLT" in the last 72 hours. BMET No results for input(s): "NA", "K", "CL", "CO2", "GLUCOSE", "BUN", "CREATININE", "CALCIUM"  in the last 72 hours. LFT No results for input(s): "PROT", "ALBUMIN", "AST", "ALT", "ALKPHOS", "BILITOT", "BILIDIR", "IBILI" in the last 72 hours. PT/INR No results for input(s): "LABPROT", "INR" in the last 72 hours.   Impression / Plan: This is a 57 y.o.female who presents for EGD/EUS to evaluate pancretic cancer screening high risk cohort.  The risks of an EUS including intestinal perforation, bleeding, infection, aspiration, and medication effects were discussed as was the possibility it may not give a definitive diagnosis if a biopsy is performed.  When a biopsy of the pancreas is done as part of the EUS, there is an additional risk of pancreatitis at the rate of about 1-2%.  It was explained that procedure related pancreatitis is typically mild, although it can be severe and even life threatening, which is why we do not  perform random pancreatic biopsies and only biopsy a lesion/area we feel is concerning enough to warrant the risk.   The risks and benefits of endoscopic evaluation/treatment were discussed with the patient and/or family; these include but are not limited to the risk of perforation, infection, bleeding, missed lesions, lack of diagnosis, severe illness requiring hospitalization, as well as anesthesia and sedation related illnesses.  The patient's history has been reviewed, patient examined, no change in status, and deemed stable for procedure.  The patient and/or family is agreeable to proceed.    Corliss Parish, MD [facility] Advanced [facility]oscopy Office # [phone]

## 2022-10-29 NOTE — Telephone Encounter (Signed)
-----   Message from [facility] sent at 10/29/2022  9:01 AM EDT ----- Regarding: Followup VC, All looks well in this patient's pancreas. There is a small area within the left lobe of potential focal fat deposition versus focal nodular hyperplasia.  Probably nothing to worry about in the long-term but with her family history of cancers we want to be mindful in regards to potential other issues. I would recommend a liver CT protocol abdomen to define if this is focal fat or FNH or benign tissue. I told her it was nonurgent and that you would consider that in the next couple of months. We will need to do an MRI/MRCP in 1 year for follow-up of her pancreas for screening purposes. If you agree with this then you can move forward with placing the CT scan at your convenience. Thanks. GM  [place], Please for now place a 1 year MRI/MRCP under Dr. Frankey Shown name for follow-up of high risk pancreas cancer screening cohort. Wait for him to reply in regards to the other scan. Thanks. GM

## 2022-10-29 NOTE — Anesthesia Postprocedure Evaluation (Signed)
Anesthesia Post Note  Patient: Theatre stage manager  Procedure(s) Performed: UPPER ESOPHAGEAL ENDOSCOPIC ULTRASOUND (EUS) ESOPHAGOGASTRODUODENOSCOPY (EGD) BIOPSY     Patient location during evaluation: Endoscopy Anesthesia Type: MAC Level of consciousness: awake Pain management: pain level controlled Vital Signs Assessment: post-procedure vital signs reviewed and stable Respiratory status: spontaneous breathing Cardiovascular status: stable Postop Assessment: no apparent nausea or vomiting Anesthetic complications: no  No notable events documented.  Last Vitals:  Vitals:   10/29/22 0900 10/29/22 0908  BP: 108/68 114/74  Pulse: (!) 49 (!) 47  Resp: 11 14  Temp:    SpO2: 96% 94%    Last Pain:  Vitals:   10/29/22 0908  TempSrc:   PainSc: 0-No pain                 Caren Macadam

## 2022-10-29 NOTE — Telephone Encounter (Signed)
===  View-only below this line=== ----- Message ----- From: Shellia Cleverly, DO Sent: 10/29/2022   9:53 AM EDT To: Richardson Chiquito, RN; Loretha Stapler, RN; * Subject: RE: Followup                                   GM- Thanks for getting her in and letting me know the results. Glad the pancreas looks good.   Dottie, Can you please get her set up for CT 3-phase Liver protocol without and with contrast to follow-up on the EUS finding. Ordering routine for sometime over the next 2 months or so is perfectly fine.   For her PC screening, plan for MRI/MRCP in 1 year, Hgb A1c and clinic f/u in 6 months with me.   Thanks!  VC

## 2022-10-29 NOTE — Transfer of Care (Signed)
Immediate Anesthesia Transfer of Care Note  Patient: Theatre stage manager  Procedure(s) Performed: UPPER ESOPHAGEAL ENDOSCOPIC ULTRASOUND (EUS) ESOPHAGOGASTRODUODENOSCOPY (EGD) BIOPSY  Patient Location: PACU and Endoscopy Unit  Anesthesia Type:MAC  Level of Consciousness: awake, alert , and oriented  Airway & Oxygen Therapy: Patient Spontanous Breathing and Patient connected to face mask oxygen  Post-op Assessment: Report given to RN and Post -op Vital signs reviewed and stable  Post vital signs: Reviewed and stable  Last Vitals:  Vitals Value Taken Time  BP    Temp    Pulse    Resp    SpO2      Last Pain:  Vitals:   10/29/22 0659  TempSrc: Tympanic  PainSc: 0-No pain         Complications: No notable events documented.

## 2022-10-30 ENCOUNTER — Ambulatory Visit: Payer: BC Managed Care – PPO | Admitting: Internal Medicine

## 2022-10-30 ENCOUNTER — Encounter (HOSPITAL_COMMUNITY): Payer: Self-pay | Admitting: Gastroenterology

## 2022-10-30 VITALS — BP 96/62 | HR 71 | Temp 97.5°F

## 2022-10-30 DIAGNOSIS — L308 Other specified dermatitis: Secondary | ICD-10-CM | POA: Diagnosis not present

## 2022-10-30 DIAGNOSIS — J301 Allergic rhinitis due to pollen: Secondary | ICD-10-CM

## 2022-10-30 DIAGNOSIS — H1013 Acute atopic conjunctivitis, bilateral: Secondary | ICD-10-CM

## 2022-10-30 DIAGNOSIS — T63441D Toxic effect of venom of bees, accidental (unintentional), subsequent encounter: Secondary | ICD-10-CM

## 2022-10-30 DIAGNOSIS — H1010 Acute atopic conjunctivitis, unspecified eye: Secondary | ICD-10-CM

## 2022-10-30 DIAGNOSIS — J3089 Other allergic rhinitis: Secondary | ICD-10-CM | POA: Diagnosis not present

## 2022-10-30 DIAGNOSIS — J4540 Moderate persistent asthma, uncomplicated: Secondary | ICD-10-CM

## 2022-10-30 MED ORDER — ALBUTEROL SULFATE HFA 108 (90 BASE) MCG/ACT IN AERS: 2.0000 | INHALATION_SPRAY | RESPIRATORY_TRACT | 2 refills | Status: DC | PRN

## 2022-10-30 MED ORDER — FLUTICASONE PROPIONATE 50 MCG/ACT NA SUSP
2.0000 | Freq: Every day | NASAL | 11 refills | Status: DC | PRN
Start: 2022-10-30 — End: 2023-11-28

## 2022-10-30 MED ORDER — EPINEPHRINE 0.3 MG/0.3ML IJ SOAJ: 0.3000 mg | INTRAMUSCULAR | 1 refills | Status: DC | PRN

## 2022-10-30 MED ORDER — FLUTICASONE-SALMETEROL 100-50 MCG/ACT IN AEPB: 1.0000 | INHALATION_SPRAY | Freq: Two times a day (BID) | RESPIRATORY_TRACT | 11 refills | Status: DC

## 2022-10-30 MED ORDER — TRIAMCINOLONE ACETONIDE 0.1 % EX OINT: 1.0000 | TOPICAL_OINTMENT | Freq: Two times a day (BID) | CUTANEOUS | 2 refills | Status: DC | PRN

## 2022-10-30 MED ORDER — MONTELUKAST SODIUM 10 MG PO TABS: 10.0000 mg | ORAL_TABLET | Freq: Every day | ORAL | 3 refills | Status: DC

## 2022-10-30 NOTE — Telephone Encounter (Signed)
I have spoken to patient to advise of CT abdomen at [facility] on 9/27 at 830am, 800 am arrival. NPO 4 hours prior. Patient verbalizes understanding. Patient is also advised that she will need MRI/MCRP in about 1 year and we will contact her to schedule this when it gets closer to that time. Advised she will need 6 month office follow up and HgA1C and we will also contact her with these appointments as it gets closer to 04/2023.

## 2022-10-30 NOTE — Patient Instructions (Addendum)
Asthma; well controlled  Breathing test looked great!  Continue montelukast 10 mg-take 1 tablet once a day to prevent coughing or wheezing Continue Wixela 1 puff twice daily  Continue albuterol 2 puffs every 4 hours if needed for wheezing or coughing spells.   You may use albuterol 2 puffs 5 to 15 minutes before exercise to decrease cough or wheeze  Allergic rhinitis: well controlled  Continue cetirizine 10 mg-take 1 tablet once a day as needed for runny nose or itchy eyes Continue Flonase 2 sprays per nostril once a day if needed for stuffy nose Consider saline nasal rinses as needed for nasal symptoms. Use this before any medicated nasal sprays for best result Continue allergen avoidance measures directed toward, grass pollen, tree pollen, weed pollen, cat, and dust mite as listed below  Allergic conjunctivitis: well controlled  Some over the counter eye drops include Pataday one drop in each eye once a day as needed for red, itchy eyes OR Zaditor one drop in each eye twice a day as needed for red itchy eyes.  Contact Dermatitis - For flares start triamcinolone 0.1% ointment twice a day until rash resolves   Stinging insects Avoid stinging insects. In case of an allergic reaction, take Benadryl 50 mg every 4 hours, and if life-threatening symptoms occur, inject with AuviQ 0.3 mg. Will recheck venom allergy today   Call the clinic if this treatment plan is not working well for you  Follow up in 12 months or sooner if problems   Thank you so much for letting me partake in your care today.  Don't hesitate to reach out if you have any additional concerns!  Ferol Luz, MD  [facility] [place], [place]

## 2022-10-30 NOTE — Progress Notes (Signed)
Follow Up Note  RE: Vanessa Goodman MRN: [id number] DOB: 06-14-1965 Date of Office Visit: 10/30/2022  Referring provider: Emilio Aspen, * Primary care provider: Emilio Aspen, MD  Chief Complaint: Asthma  History of Present Illness: I had the pleasure of seeing Vanessa Goodman for a follow up visit at the [facility] on 10/30/2022. She is a 57 y.o. female, who is being followed for persistent asthma, allergic rhinitis, dermatitis, stinging insect allergy. Her previous allergy office visit was on 09/25/21 with Dr. Marlynn Perking. Today is a regular follow up visit.  History obtained from patient, chart review.  ASTHMA - Medical therapy: Wixela  100 mcg 1 puff daily, montelukast 10 mg daily - Rescue inhaler use: rare use - Symptoms: Denies any cough, wheeze, dyspnea - Exacerbation history: 0 ABX for respiratory illness since last visit, 0 OCS, 0ED, 0 UC visits in the past year  - ACT: 25 /25 - Adverse effects of medication: denies  - Previous FEV1: 2.62L, 90% - Biologic Labs not applicable   Her rhinitis is well-controlled with Zyrtec 10 mg daily, with the addition of Flonase in the spring and fall.   She has had some intermittent contact dermatitis this past spring.  Associates it with walking [place] golf [place].  Good response to triamcinolone.    She does have a history of stinging insect allergy.  Denies any field stings since last visit.  She is not interested in venom immunotherapy and does have an EpiPen. She is   Assessment and Plan: Vanessa Goodman is a 57 y.o. female with: Moderate persistent asthma without complication - Plan: Spirometry with Graph  Other allergic rhinitis  Other specified dermatitis  Toxic effect of venom of bees, unintentional, subsequent encounter - Plan: Hymenoptera Venom Allergy Prof, CANCELED: Allergen Hymenoptera Panel  Seasonal allergic conjunctivitis  Seasonal allergic rhinitis due to pollen - Plan: fluticasone (FLONASE) 50  MCG/ACT nasal spray Plan: Patient Instructions  Asthma; well controlled  Breathing test looked great!  Continue montelukast 10 mg-take 1 tablet once a day to prevent coughing or wheezing Continue Wixela 1 puff twice daily  Continue albuterol 2 puffs every 4 hours if needed for wheezing or coughing spells.   You may use albuterol 2 puffs 5 to 15 minutes before exercise to decrease cough or wheeze  Allergic rhinitis: well controlled  Continue cetirizine 10 mg-take 1 tablet once a day as needed for runny nose or itchy eyes Continue Flonase 2 sprays per nostril once a day if needed for stuffy nose Consider saline nasal rinses as needed for nasal symptoms. Use this before any medicated nasal sprays for best result Continue allergen avoidance measures directed toward, grass pollen, tree pollen, weed pollen, cat, and dust mite as listed below  Allergic conjunctivitis: well controlled  Some over the counter eye drops include Pataday one drop in each eye once a day as needed for red, itchy eyes OR Zaditor one drop in each eye twice a day as needed for red itchy eyes.  Contact Dermatitis - For flares start triamcinolone 0.1% ointment twice a day until rash resolves   Stinging insects Avoid stinging insects. In case of an allergic reaction, take Benadryl 50 mg every 4 hours, and if life-threatening symptoms occur, inject with AuviQ 0.3 mg. Will recheck venom allergy today   Call the clinic if this treatment plan is not working well for you  Follow up in 12 months or sooner if problems   Thank you so much for  letting me partake in your care today.  Don't hesitate to reach out if you have any additional concerns!  Ferol Luz, MD  [facility] [place]    No follow-ups on file.  Meds ordered this encounter  Medications   montelukast (SINGULAIR) 10 MG tablet    Sig: Take 1 tablet (10 mg total) by mouth at bedtime.    Dispense:  90 tablet    Refill:  3    EPINEPHrine 0.3 mg/0.3 mL IJ SOAJ injection    Sig: Inject 0.3 mg into the muscle as needed for anaphylaxis.    Dispense:  2 each    Refill:  1    MYLAN OR TEVA   fluticasone-salmeterol (WIXELA INHUB) 100-50 MCG/ACT AEPB    Sig: Inhale 1 puff into the lungs 2 (two) times daily.    Dispense:  60 each    Refill:  11   fluticasone (FLONASE) 50 MCG/ACT nasal spray    Sig: Place 2 sprays into both nostrils daily as needed for allergies or rhinitis.    Dispense:  16 g    Refill:  11   albuterol (VENTOLIN HFA) 108 (90 Base) MCG/ACT inhaler    Sig: Inhale 2 puffs into the lungs every 4 (four) hours as needed for wheezing or shortness of breath.    Dispense:  18 g    Refill:  2   triamcinolone ointment (KENALOG) 0.1 %    Sig: Apply 1 Application topically 2 (two) times daily as needed.    Dispense:  30 g    Refill:  2    Lab Orders         Hymenoptera Venom Allergy Prof     Diagnostics: Spirometry:  Tracings reviewed. Her effort: Good reproducible efforts. FVC: 3.21L FEV1: 2.72 L, 97% predicted FEV1/FVC ratio: 82% Interpretation: Spirometry consistent with normal pattern.  Please see scanned spirometry results for details.  Skin Testing: None.     Medication List:  Current Outpatient Medications  Medication Sig Dispense Refill   albuterol (PROAIR HFA) 108 (90 Base) MCG/ACT inhaler 2 puffs every 4 hours if needed for wheezing or coughing spells. 1 each 3   albuterol (VENTOLIN HFA) 108 (90 Base) MCG/ACT inhaler Inhale 2 puffs into the lungs every 4 (four) hours as needed for wheezing or shortness of breath. 18 g 2   Cetirizine HCl 10 MG CAPS 1 capsule daily in the afternoon.     EPINEPHrine 0.3 mg/0.3 mL IJ SOAJ injection Inject 0.3 mg into the muscle as needed for anaphylaxis. 2 each 1   fluticasone (FLONASE) 50 MCG/ACT nasal spray Place 2 sprays into both nostrils daily as needed for allergies or rhinitis. 16 g 11   fluticasone-salmeterol (WIXELA INHUB) 100-50 MCG/ACT AEPB  Inhale 1 puff into the lungs 2 (two) times daily. 60 each 11   montelukast (SINGULAIR) 10 MG tablet Take 1 tablet (10 mg total) by mouth at bedtime. 90 tablet 3   triamcinolone ointment (KENALOG) 0.1 % Apply 1 Application topically 2 (two) times daily as needed. 30 g 2   No current facility-administered medications for this visit.   Allergies: Allergies  Allergen Reactions   Bee Venom Hives and Rash   I reviewed her past medical history, social history, family history, and environmental history and no significant changes have been reported from her previous visit.  ROS: All others negative except as noted per HPI.   Objective: BP 96/62   Pulse 71   Temp (!) 97.5  F (36.4 C) (Temporal)   SpO2 97%  There is no height or weight on file to calculate BMI. General Appearance:  Alert, cooperative, no distress, appears stated age  Head:  Normocephalic, without obvious abnormality, atraumatic  Eyes:  Conjunctiva clear, EOM's intact  Nose: Nares normal, normal nasal mucosa, no rhinorrhea   Throat: Lips, tongue normal; teeth and gums normal,   Neck: Supple, symmetrical  Lungs:   , Respirations unlabored, no coughing, CTAB   Heart:  , Appears well perfused  Extremities: No edema  Skin: Skin color, texture, turgor normal, no rashes or lesions on visualized portions of skin   Neurologic: No gross deficits   Previous notes and tests were reviewed. The plan was reviewed with the patient/family, and all questions/concerned were addressed.  It was my pleasure to see Vanessa Goodman today and participate in her care. Please feel free to contact me with any questions or concerns.  Sincerely,  Ferol Luz, MD  Allergy & Immunology  [facility] Office: [phone]

## 2022-10-31 LAB — SURGICAL PATHOLOGY

## 2022-11-01 ENCOUNTER — Encounter: Payer: Self-pay | Admitting: Gastroenterology

## 2022-11-04 LAB — HYMENOPTERA VENOM ALLERGY PROF
I001-IgE Honeybee: <0.1 kU/L
I003-IgE Yellow Jacket: 0.44 kU/L — AB
I004-IgE Paper Wasp: <0.1 kU/L
I208-IgE Api m 1: <0.1 kU/L
I209-IgE Ves v 5: 0.38 kU/L — AB
I210-IgE Pol d 5: 0.12 kU/L — AB
I211-IgE Ves v 1: <0.1 kU/L
I214-IgE Api m 2: <0.1 kU/L
I215-IgE Api m 3: <0.1 kU/L
I216-IgE Api m 5: <0.1 kU/L
I217-IgE Api m 10: <0.1 kU/L
Tryptase: 6.7 ug/L (ref 2.2–13.2)

## 2022-11-04 LAB — ALLERGEN COMPONENT COMMENTS

## 2022-11-11 NOTE — Progress Notes (Signed)
Blood work still positive to yellow jacket (although decreased).  Recommend indefinite venom immunotherapy.  Can someone let patient know?

## 2022-11-30 ENCOUNTER — Ambulatory Visit (HOSPITAL_COMMUNITY): Payer: BC Managed Care – PPO

## 2022-12-06 ENCOUNTER — Ambulatory Visit
Admission: RE | Admit: 2022-12-06 | Discharge: 2022-12-06 | Disposition: A | Discharge status: Home / Self Care | Payer: BC Managed Care – PPO | Source: Ambulatory Visit | Attending: Gastroenterology | Admitting: Gastroenterology

## 2022-12-06 DIAGNOSIS — R933 Abnormal findings on diagnostic imaging of other parts of digestive tract: Secondary | ICD-10-CM

## 2022-12-06 DIAGNOSIS — D1803 Hemangioma of intra-abdominal structures: Secondary | ICD-10-CM | POA: Diagnosis not present

## 2022-12-06 MED ORDER — IOPAMIDOL (ISOVUE-370) INJECTION 76%
200.0000 mL | Freq: Once | INTRAVENOUS | Status: AC | PRN
  Administered 2022-12-06: 100 mL via INTRAVENOUS

## 2022-12-17 NOTE — Telephone Encounter (Signed)
Dottie this CT was ordered for Dr Barron Alvine to review.

## 2023-02-05 DIAGNOSIS — E611 Iron deficiency: Secondary | ICD-10-CM | POA: Diagnosis not present

## 2023-02-05 DIAGNOSIS — E039 Hypothyroidism, unspecified: Secondary | ICD-10-CM | POA: Diagnosis not present

## 2023-02-05 DIAGNOSIS — R5383 Other fatigue: Secondary | ICD-10-CM | POA: Diagnosis not present

## 2023-02-05 DIAGNOSIS — R6882 Decreased libido: Secondary | ICD-10-CM | POA: Diagnosis not present

## 2023-02-05 DIAGNOSIS — N951 Menopausal and female climacteric states: Secondary | ICD-10-CM | POA: Diagnosis not present

## 2023-02-05 DIAGNOSIS — G4700 Insomnia, unspecified: Secondary | ICD-10-CM | POA: Diagnosis not present

## 2023-03-14 DIAGNOSIS — G4700 Insomnia, unspecified: Secondary | ICD-10-CM | POA: Diagnosis not present

## 2023-03-14 DIAGNOSIS — R6882 Decreased libido: Secondary | ICD-10-CM | POA: Diagnosis not present

## 2023-03-14 DIAGNOSIS — N951 Menopausal and female climacteric states: Secondary | ICD-10-CM | POA: Diagnosis not present

## 2023-03-14 DIAGNOSIS — R5383 Other fatigue: Secondary | ICD-10-CM | POA: Diagnosis not present

## 2023-04-08 NOTE — Addendum Note (Signed)
Addended by: Richardson Chiquito on: 04/08/2023 11:40 AM   Modules accepted: Orders

## 2023-04-09 DIAGNOSIS — J4530 Mild persistent asthma, uncomplicated: Secondary | ICD-10-CM | POA: Diagnosis not present

## 2023-04-09 DIAGNOSIS — Z0000 Encounter for general adult medical examination without abnormal findings: Secondary | ICD-10-CM | POA: Diagnosis not present

## 2023-04-09 DIAGNOSIS — N951 Menopausal and female climacteric states: Secondary | ICD-10-CM | POA: Diagnosis not present

## 2023-04-09 DIAGNOSIS — Z79899 Other long term (current) drug therapy: Secondary | ICD-10-CM | POA: Diagnosis not present

## 2023-04-09 DIAGNOSIS — Z711 Person with feared health complaint in whom no diagnosis is made: Secondary | ICD-10-CM | POA: Diagnosis not present

## 2023-04-09 DIAGNOSIS — Z23 Encounter for immunization: Secondary | ICD-10-CM | POA: Diagnosis not present

## 2023-04-09 DIAGNOSIS — Z13220 Encounter for screening for lipoid disorders: Secondary | ICD-10-CM | POA: Diagnosis not present

## 2023-04-09 DIAGNOSIS — F5101 Primary insomnia: Secondary | ICD-10-CM | POA: Diagnosis not present

## 2023-04-15 DIAGNOSIS — N951 Menopausal and female climacteric states: Secondary | ICD-10-CM | POA: Diagnosis not present

## 2023-04-15 DIAGNOSIS — E039 Hypothyroidism, unspecified: Secondary | ICD-10-CM | POA: Diagnosis not present

## 2023-05-03 ENCOUNTER — Encounter: Payer: Self-pay | Admitting: Gastroenterology

## 2023-05-03 ENCOUNTER — Other Ambulatory Visit (INDEPENDENT_AMBULATORY_CARE_PROVIDER_SITE_OTHER): Payer: BC Managed Care – PPO

## 2023-05-03 ENCOUNTER — Ambulatory Visit: Payer: BC Managed Care – PPO | Admitting: Gastroenterology

## 2023-05-03 VITALS — BP 130/70 | HR 77 | Ht 66.0 in | Wt 164.5 lb

## 2023-05-03 DIAGNOSIS — Z1211 Encounter for screening for malignant neoplasm of colon: Secondary | ICD-10-CM

## 2023-05-03 DIAGNOSIS — R933 Abnormal findings on diagnostic imaging of other parts of digestive tract: Secondary | ICD-10-CM | POA: Diagnosis not present

## 2023-05-03 DIAGNOSIS — Z800 Family history of malignant neoplasm of digestive organs: Secondary | ICD-10-CM | POA: Diagnosis not present

## 2023-05-03 DIAGNOSIS — D1803 Hemangioma of intra-abdominal structures: Secondary | ICD-10-CM | POA: Diagnosis not present

## 2023-05-03 DIAGNOSIS — Z83719 Family history of colon polyps, unspecified: Secondary | ICD-10-CM | POA: Diagnosis not present

## 2023-05-03 DIAGNOSIS — Z853 Personal history of malignant neoplasm of breast: Secondary | ICD-10-CM

## 2023-05-03 LAB — HEMOGLOBIN A1C: Hgb A1c MFr Bld: 5.9 % (ref 4.6–6.5)

## 2023-05-03 MED ORDER — SUFLAVE 178.7 G PO SOLR
1.0000 | Freq: Once | ORAL | 0 refills | Status: AC
Start: 2023-05-03 — End: 2023-05-03

## 2023-05-03 NOTE — Patient Instructions (Addendum)
 _______________________________________________________  If your blood pressure at your visit was 140/90 or greater, please contact your primary care physician to follow up on this.  If you are age 56 or younger, your body mass index should be between 19-25. Your Body mass index is 26.55 kg/m. If this is out of the aformentioned range listed, please consider follow up with your Primary Care Provider.  ________________________________________________________  The [facility] providers would like to encourage you to use [facility] to communicate with providers for non-urgent requests or questions.  Due to long hold times on the telephone, sending your provider a message by [facility] may be a faster and more efficient way to get a response.  Please allow 48 business hours for a response.  Please remember that this is for non-urgent requests.  _______________________________________________________  Your provider has requested that you go to the basement level for lab work before leaving today. Press "B" on the elevator. The lab is located at the first door on the left as you exit the elevator.  You have been scheduled for a colonoscopy. Please follow written instructions given to you at your visit today.   If you use inhalers (even only as needed), please bring them with you on the day of your procedure.  DO NOT TAKE 7 DAYS PRIOR TO TEST- Trulicity (dulaglutide) Ozempic, Wegovy (semaglutide) Mounjaro (tirzepatide) Bydureon Bcise (exanatide extended release)  DO NOT TAKE 1 DAY PRIOR TO YOUR TEST Rybelsus (semaglutide) Adlyxin (lixisenatide) Victoza (liraglutide) Byetta (exanatide) ___________________________________________________________________________  Vanessa Goodman will receive your bowel preparation through [facility], which ensures the lowest copay and home delivery, with outreach via text or call from an [phone] number. Please respond promptly to avoid rescheduling of your procedure. If you are  interested in alternative options or have any questions regarding your prep, please contact them at [phone] ____________________________________________________________________________  Your Provider Has Sent Your Bowel Prep Regimen To [facility]   [facility] will contact you to verify your information and collect your copay, if applicable. Enjoy the comfort of your home while your prescription is mailed to you, FREE of any shipping charges.   [facility] accepts all major insurance benefits and applies discounts & coupons.  Have additional questions?   Chat: [email or web address] Call: [phone] Email: [email or web address] NCPDP: [id number]  How will [facility] contact you?  With a Welcome phone call,  a Welcome text and a checkout link in text form.  Texts you receive from [phone] Are NOT Spam.  *To set up delivery, you must complete the checkout process via link or speak to one of the patient care representatives. If [facility] is unable to reach you, your prescription may be delayed.  To avoid long hold times on the phone, you may also utilize the secure chat feature on the [facility] website to request that they call you back for transaction completion or to expedite your concerns.  Due to recent changes in healthcare laws, you may see the results of your imaging and laboratory studies on MyChart before your provider has had a chance to review them.  We understand that in some cases there may be results that are confusing or concerning to you. Not all laboratory results come back in the same time frame and the provider may be waiting for multiple results in order to interpret others.  Please give [place] 48 hours in order for your provider to thoroughly review all the results before contacting the office for clarification of your results.   You will be due to have MRI/MRCP in August  2025.  We will contact you to schedule this appointment.  It was a pleasure to see  you today!  Vito Cirigliano, D.O.

## 2023-05-03 NOTE — Progress Notes (Signed)
 Chief Complaint:    Family history of Pancreatic Cancer, Pancreatic Cancer screening  GI History: Vanessa Goodman is a 58 y.o. 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 Her father was diagnosed with metastatic Pancreatic Cancer in Jun 01, 2021, and died within just 3 weeks of diagnosis.     Family history: - Mother: Breast cancer age 86, large colon polyp requiring surgical resection age 59 - Father: Pancreatic cancer age 32 - Sister: Breast cancer age 15 - Paternal aunt: Pancreatic cancer age 71 - PGF: Lung cancer - Maternal cousin: Breast cancer in her 38s  Genetic Testing results from 06/2021: No pathogenic mutations   Pancreatic Cancer screening protocol: Up-to-date with most recent studies as below - Initiated screening in 07/2021 - Jun 02, 2022: Hgb A1c 5.8% - 07/25/2021: MRI/MRCP: Normal - 10/29/2022: EUS: Normal pancreas.  Focal hyperechoic echotexture in the left lobe of the liver measuring 12 x 10 mm, otherwise normal liver, CBD.  2 cm HH, J-shaped stomach, mild antral gastritis (path benign)   2) Family history of advanced colon polyps   Endoscopic History: - Colonoscopy (07/2014, [facility]): Normal per patient. No report available for review, but recommended repeat in 5 years d/t Fhx - Colonoscopy (08/2021, Dr. Barron Alvine): Moderate stool throughout with fair prep.  Normal visualized colon.  Normal TI.  Repeat in 2 years due to suboptimal bowel preparation.  HPI:     Patient is a 58 y.o. female presenting to the Gastroenterology Clinic for routine follow-up.  Was last seen in the office by me on 05/23/2022.  Has since completed EUS in 10/2022 (normal pancreas; 12 x 10 mm hyperechoic echotexture in the left lobe of the liver).  Follow-up CT in 12/2022 confirmed 9  mm hepatic hemangioma, and otherwise normal-appearing liver.  No repeat imaging for this benign finding needed.  Otherwise feels well and no active issues or complaints today.  Due for repeat colonoscopy this year.    Review of systems:     No chest pain, no SOB, no fevers, no urinary sx   Past Medical History:  Diagnosis Date   Asthma    Breast cancer (HCC)    dx at 23   Recurrent upper respiratory infection (URI)     Patient's surgical history, family medical history, social history, medications and allergies were all reviewed in Epic    Current Outpatient Medications  Medication Sig Dispense Refill   albuterol (PROAIR HFA) 108 (90 Base) MCG/ACT inhaler 2 puffs every 4 hours if needed for wheezing or coughing spells. 1 each 3   Ascorbic Acid (VITAMIN C) 1000 MG tablet Take 1,000 mg by mouth daily.     Cetirizine HCl 10 MG CAPS 1 capsule daily in the afternoon.     EPINEPHrine 0.3 mg/0.3 mL IJ SOAJ injection Inject 0.3 mg into the muscle as needed for anaphylaxis. 2 each 1   estradiol (ESTRACE) 0.5 MG tablet Take 0.5 mg by mouth daily.     fluticasone (FLONASE) 50 MCG/ACT nasal spray Place 2 sprays into both nostrils daily as needed for allergies or rhinitis. 16 g 11   fluticasone-salmeterol (WIXELA INHUB) 100-50 MCG/ACT AEPB Inhale 1 puff into the lungs 2 (two) times daily. 60 each 11   montelukast (SINGULAIR) 10 MG tablet Take 1 tablet (10 mg total) by mouth  at bedtime. 90 tablet 3   Omega-3 Fatty Acids (OMEGA-3 PO) Take by mouth. 400/300MG  one tablet daily     progesterone (PROMETRIUM) 200 MG capsule Take 400 mg by mouth daily.     Vitamin D-Vitamin K (VITAMIN K2-VITAMIN D3 PO) Take 5,000 Int'l Units/1.104m2 by mouth daily.     No current facility-administered medications for this visit.    Physical Exam:     Ht 5\' 6"  (1.676 m)   Wt 164 lb 8 oz (74.6 kg)   BMI 26.55 kg/m   GENERAL:  Pleasant female in NAD PSYCH: : Cooperative, normal affect  NEURO: Alert and oriented  x 3, no focal neurologic deficits   IMPRESSION and PLAN:    1) Family History of Pancreatic Cancer 2) Personal Hx of Breast CA Continue PC screening protocol per familial [facility]red guidelines - MRI/MRCP in 10/2023 for ongoing PC screening.  If normal/unremarkable, EUS in 10/2024 and continue with annual screening in alternating fashion - Check hemoglobin A1c   3) Family history of advanced colon polyps Colonoscopy 2023 with suboptimal bowel prep - Repeat colonoscopy in 07/2023.  Will schedule today with prep instructions   4) Hepatic hemangioma EUS in 10/2022 with hyperechoic lesion in the liver.  Subsequent CT in 12/2022 confirmed benign 9 mm hepatic hemangioma with otherwise normal-appearing liver.  No repeat imaging needed for this benign finding.   RTC in 1 year or sooner as needed       The indications, risks, and benefits of colonoscopy were explained to the patient in detail. Risks include but are not limited to bleeding, perforation, adverse reaction to medications, and cardiopulmonary compromise. Sequelae include but are not limited to the possibility of surgery, hospitalization, and mortality. The patient verbalized understanding and wished to proceed. All questions answered, referred to the scheduler and bowel prep ordered. Further recommendations pending results of the exam.         Shellia Cleverly ,DO, FACG 05/03/2023, 8:59 AM

## 2023-05-06 ENCOUNTER — Encounter: Payer: Self-pay | Admitting: Gastroenterology

## 2023-05-14 DIAGNOSIS — J029 Acute pharyngitis, unspecified: Secondary | ICD-10-CM | POA: Diagnosis not present

## 2023-06-03 DIAGNOSIS — D225 Melanocytic nevi of trunk: Secondary | ICD-10-CM | POA: Diagnosis not present

## 2023-06-03 DIAGNOSIS — L814 Other melanin hyperpigmentation: Secondary | ICD-10-CM | POA: Diagnosis not present

## 2023-06-03 DIAGNOSIS — D2262 Melanocytic nevi of left upper limb, including shoulder: Secondary | ICD-10-CM | POA: Diagnosis not present

## 2023-06-03 DIAGNOSIS — L570 Actinic keratosis: Secondary | ICD-10-CM | POA: Diagnosis not present

## 2023-06-13 DIAGNOSIS — Z6826 Body mass index (BMI) 26.0-26.9, adult: Secondary | ICD-10-CM | POA: Diagnosis not present

## 2023-06-13 DIAGNOSIS — Z124 Encounter for screening for malignant neoplasm of cervix: Secondary | ICD-10-CM | POA: Diagnosis not present

## 2023-06-13 DIAGNOSIS — Z01419 Encounter for gynecological examination (general) (routine) without abnormal findings: Secondary | ICD-10-CM | POA: Diagnosis not present

## 2023-06-18 DIAGNOSIS — E039 Hypothyroidism, unspecified: Secondary | ICD-10-CM | POA: Diagnosis not present

## 2023-06-18 DIAGNOSIS — Z1321 Encounter for screening for nutritional disorder: Secondary | ICD-10-CM | POA: Diagnosis not present

## 2023-06-18 DIAGNOSIS — N951 Menopausal and female climacteric states: Secondary | ICD-10-CM | POA: Diagnosis not present

## 2023-06-18 DIAGNOSIS — E559 Vitamin D deficiency, unspecified: Secondary | ICD-10-CM | POA: Diagnosis not present

## 2023-06-18 DIAGNOSIS — R5383 Other fatigue: Secondary | ICD-10-CM | POA: Diagnosis not present

## 2023-07-18 ENCOUNTER — Encounter: Payer: Self-pay | Admitting: Gastroenterology

## 2023-08-01 ENCOUNTER — Ambulatory Visit: Payer: BC Managed Care – PPO | Admitting: Gastroenterology

## 2023-08-01 ENCOUNTER — Encounter: Payer: Self-pay | Admitting: Gastroenterology

## 2023-08-01 VITALS — BP 93/46 | HR 60 | Temp 98.0°F | Resp 13 | Ht 66.0 in | Wt 158.6 lb

## 2023-08-01 DIAGNOSIS — K562 Volvulus: Secondary | ICD-10-CM

## 2023-08-01 DIAGNOSIS — Z1211 Encounter for screening for malignant neoplasm of colon: Secondary | ICD-10-CM

## 2023-08-01 DIAGNOSIS — D125 Benign neoplasm of sigmoid colon: Secondary | ICD-10-CM

## 2023-08-01 MED ORDER — SODIUM CHLORIDE 0.9 % IV SOLN: 500.0000 mL | Freq: Once | INTRAVENOUS | Status: DC

## 2023-08-01 NOTE — Progress Notes (Signed)
 Sedate, gd SR, tolerated procedure well, VSS, report to RN

## 2023-08-01 NOTE — Progress Notes (Signed)
 GASTROENTEROLOGY PROCEDURE H&P NOTE   Primary Care Physician: Benedetta Bradley, MD    Reason for Procedure:  Colon cancer screening  Plan:    Colonoscopy  Patient is appropriate for endoscopic procedure(s) in the ambulatory ([facility]) setting.  The nature of the procedure, as well as the risks, benefits, and alternatives were carefully and thoroughly reviewed with the patient. Ample time for discussion and questions allowed. The patient understood, was satisfied, and agreed to proceed.     HPI: Vanessa Goodman is a 58 y.o. female who presents for colonoscopy for CRC screening.  Family history notable for advanced colon polyps (mother).  Last colonoscopy was 08/2021 and notable for fair prep with plan for short interval repeat.  Otherwise no lower GI symptoms.  Endoscopic History: - Colonoscopy (07/2014, [facility]): Normal per patient. No report available for review, but recommended repeat in 5 years d/t Fhx - Colonoscopy (08/2021, Dr. Karene Oto): Moderate stool throughout with fair prep.  Normal visualized colon.  Normal TI.  Repeat in 2 years due to suboptimal bowel preparation.  Past Medical History:  Diagnosis Date   Asthma    Breast cancer (HCC)    dx at 26   Recurrent upper respiratory infection (URI)     Past Surgical History:  Procedure Laterality Date   BIOPSY  10/29/2022   Procedure: BIOPSY;  Surgeon: Mansouraty, Albino Alu., MD;  Location: [facility];  Service: Gastroenterology;;   COLONOSCOPY     [facility] thinks around age 34-50. Normal   ESOPHAGOGASTRODUODENOSCOPY N/A 10/29/2022   Procedure: ESOPHAGOGASTRODUODENOSCOPY (EGD);  Surgeon: Normie Becton., MD;  Location: [facility];  Service: Gastroenterology;  Laterality: N/A;   MASTECTOMY Bilateral    UPPER ESOPHAGEAL ENDOSCOPIC ULTRASOUND (EUS) N/A 10/29/2022   Procedure: UPPER ESOPHAGEAL ENDOSCOPIC ULTRASOUND (EUS);  Surgeon: Normie Becton., MD;  Location: [facility];  Service:  Gastroenterology;  Laterality: N/A;    Prior to Admission medications   Medication Sig Start Date End Date Taking? Authorizing Provider  albuterol  (PROAIR  HFA) 108 (90 Base) MCG/ACT inhaler 2 puffs every 4 hours if needed for wheezing or coughing spells. 08/18/20   Ambs, Jeanmarie Millet, FNP  AMBULATORY NON FORMULARY MEDICATION Medication Name: Testosterone  5mg  sublingual every morning.    [provider]  Ascorbic Acid (VITAMIN C) 1000 MG tablet Take 1,000 mg by mouth daily.    [provider]  Cetirizine HCl 10 MG CAPS 1 capsule daily in the afternoon.    [provider]  EPINEPHrine  0.3 mg/0.3 mL IJ SOAJ injection Inject 0.3 mg into the muscle as needed for anaphylaxis. 10/30/22   Orelia Binet, MD  estradiol  (ESTRACE ) 0.5 MG tablet Take 0.5 mg by mouth daily. 03/14/23   [provider]  fluticasone  (FLONASE ) 50 MCG/ACT nasal spray Place 2 sprays into both nostrils daily as needed for allergies or rhinitis. 10/30/22   Orelia Binet, MD  fluticasone -salmeterol (WIXELA INHUB) 100-50 MCG/ACT AEPB Inhale 1 puff into the lungs 2 (two) times daily. 10/30/22   Orelia Binet, MD  montelukast  (SINGULAIR ) 10 MG tablet Take 1 tablet (10 mg total) by mouth at bedtime. 10/30/22   Orelia Binet, MD  Omega-3 Fatty Acids (OMEGA-3 PO) Take by mouth. 400/300MG  one tablet daily    [provider]  Pregnenolone Micronized 50 MG TABS Take 75 mg by mouth daily.    [provider]  progesterone  (PROMETRIUM ) 200 MG capsule Take 400 mg by mouth daily.    [provider]  Vitamin D-Vitamin K (VITAMIN  K2-VITAMIN D3 PO) Take 5,000 Int'l Units/1.46m2 by mouth daily.    [provider]    Current Outpatient Medications  Medication Sig Dispense Refill   albuterol  (PROAIR  HFA) 108 (90 Base) MCG/ACT inhaler 2 puffs every 4 hours if needed for wheezing or coughing spells. 1 each 3   AMBULATORY NON FORMULARY MEDICATION Medication Name: Testosterone  5mg   sublingual every morning.     Ascorbic Acid (VITAMIN C) 1000 MG tablet Take 1,000 mg by mouth daily.     Cetirizine HCl 10 MG CAPS 1 capsule daily in the afternoon.     EPINEPHrine  0.3 mg/0.3 mL IJ SOAJ injection Inject 0.3 mg into the muscle as needed for anaphylaxis. 2 each 1   estradiol  (ESTRACE ) 0.5 MG tablet Take 0.5 mg by mouth daily.     fluticasone  (FLONASE ) 50 MCG/ACT nasal spray Place 2 sprays into both nostrils daily as needed for allergies or rhinitis. 16 g 11   fluticasone -salmeterol (WIXELA INHUB) 100-50 MCG/ACT AEPB Inhale 1 puff into the lungs 2 (two) times daily. 60 each 11   montelukast  (SINGULAIR ) 10 MG tablet Take 1 tablet (10 mg total) by mouth at bedtime. 90 tablet 3   Omega-3 Fatty Acids (OMEGA-3 PO) Take by mouth. 400/300MG  one tablet daily     Pregnenolone Micronized 50 MG TABS Take 75 mg by mouth daily.     progesterone  (PROMETRIUM ) 200 MG capsule Take 400 mg by mouth daily.     Vitamin D-Vitamin K (VITAMIN K2-VITAMIN D3 PO) Take 5,000 Int'l Units/1.7m2 by mouth daily.     No current facility-administered medications for this visit.    Allergies as of 08/01/2023 - Review Complete 05/03/2023  Allergen Reaction Noted   Bee venom Hives and Rash 09/24/2013    Family History  Problem Relation Age of Onset   Breast cancer Mother 74   Colon polyps Mother        said 6-7in of colon removed due to polyps   Asthma Father    Allergic rhinitis Father    Pancreatic cancer Father 44   Breast cancer Sister 25   Pancreatic cancer Paternal Aunt 35   Asthma Maternal Grandmother    Diabetes Maternal Grandfather    Lung cancer Paternal Grandfather    Breast cancer Cousin        mom's neice dx 40's   Eczema Neg Hx    Urticaria Neg Hx    Esophageal cancer Neg Hx     Social History   Socioeconomic History   Marital status: Married    Spouse name: Not on file   Number of children: Not on file   Years of education: Not on file   Highest education level: Not on file   Occupational History   Not on file  Tobacco Use   Smoking status: Never   Smokeless tobacco: Never  Vaping Use   Vaping status: Never Used  Substance and Sexual Activity   Alcohol use: Yes    Comment: rare   Drug use: Never   Sexual activity: Not on file  Other Topics Concern   Not on file  Social History Narrative   Not on file   Social Drivers of Health   Financial Resource Strain: Not on file  Food Insecurity: Not on file  Transportation Needs: Not on file  Physical Activity: Not on file  Stress: Not on file  Social Connections: Not on file  Intimate Partner Violence: Not on file    Physical Exam: Vital signs in last  24 hours: @There  were no vitals taken for this visit. GEN: NAD EYE: Sclerae anicteric ENT: MMM CV: Non-tachycardic Pulm: CTA b/l GI: Soft, NT/ND NEURO:  Alert & Oriented x 3   Harry Lindau, DO [facility]   08/01/2023 12:41 PM

## 2023-08-01 NOTE — Op Note (Signed)
 [facility] Patient Name: Vanessa Goodman Procedure Date: 08/01/2023 1:34 PM MRN: [id number] Endoscopist: Harry Lindau , MD, [id number] Age: 58 Referring MD:  Date of Birth: 09-Feb-1966 Gender: Female Account #: [id number] Procedure:                Colonoscopy Indications:              Colon cancer screening in patient with 1st-degree                            relative having advanced adenoma of the colon                           Family history notable for advanced colon polyps                            (mother). Last colonoscopy was 08/2021 and notable                            for fair prep with plan for short interval repeat.                            Otherwise no lower GI symptoms. Medicines:                Monitored Anesthesia Care Procedure:                Pre-Anesthesia Assessment:                           - Prior to the procedure, a History and Physical                            was performed, and patient medications and                            allergies were reviewed. The patient's tolerance of                            previous anesthesia was also reviewed. The risks                            and benefits of the procedure and the sedation                            options and risks were discussed with the patient.                            All questions were answered, and informed consent                            was obtained. Prior Anticoagulants: The patient has                            taken no anticoagulant or antiplatelet agents. ASA  Grade Assessment: II - A patient with mild systemic                            disease. After reviewing the risks and benefits,                            the patient was deemed in satisfactory condition to                            undergo the procedure.                           After obtaining informed consent, the colonoscope                            was passed under direct vision.  Throughout the                            procedure, the patient's blood pressure, pulse, and                            oxygen saturations were monitored continuously. The                            Olympus Scope SN [id number] was introduced through the                            anus and advanced to the the cecum, identified by                            appendiceal orifice and ileocecal valve. The                            colonoscopy was performed without difficulty. The                            patient tolerated the procedure well. The quality                            of the bowel preparation was good. The ileocecal                            valve, appendiceal orifice, and rectum were                            photographed. Scope In: 1:39:58 PM Scope Out: 1:57:54 PM Scope Withdrawal Time: 0 hours 11 minutes 56 seconds  Total Procedure Duration: 0 hours 17 minutes 56 seconds  Findings:                 The perianal and digital rectal examinations were                            normal.  A 3 mm polyp was found in the sigmoid colon. The                            polyp was sessile. The polyp was removed with a                            cold snare. Resection and retrieval were complete.                            Estimated blood loss was minimal.                           The exam was otherwise normal throughout the                            remainder of the colon.                           The retroflexed view of the distal rectum and anal                            verge was normal and showed no anal or rectal                            abnormalities.                           The ascending colon revealed moderately excessive                            looping. Advancing the scope required straightening                            and shortening the scope to obtain bowel loop                            reduction. Complications:            No immediate  complications. Estimated Blood Loss:     Estimated blood loss was minimal. Impression:               - One 3 mm polyp in the sigmoid colon, removed with                            a cold snare. Resected and retrieved.                           - The distal rectum and anal verge are normal on                            retroflexion view.                           - There was significant looping of the colon. Recommendation:           -  Patient has a contact number available for                            emergencies. The signs and symptoms of potential                            delayed complications were discussed with the                            patient. Return to normal activities tomorrow.                            Written discharge instructions were provided to the                            patient.                           - Resume previous diet.                           - Continue present medications.                           - Await pathology results.                           - Repeat colonoscopy in 5 years for surveillance                            due to family history.                           - Return to GI office PRN. Harry Lindau, MD 08/01/2023 2:03:46 PM

## 2023-08-01 NOTE — Progress Notes (Signed)
 Updated medical record.

## 2023-08-01 NOTE — Progress Notes (Signed)
 Called to room to assist during endoscopic procedure.  Patient ID and intended procedure confirmed with present staff. Received instructions for my participation in the procedure from the performing physician.

## 2023-08-01 NOTE — Patient Instructions (Addendum)
-  Handout on polyps provided. -await pathology results. -repeat colonoscopy for surveillance recommended in 5 years -Continue present medications -Return to GI clinic as needed   YOU HAD AN ENDOSCOPIC PROCEDURE TODAY AT THE [facility]:   Refer to the procedure report that was given to you for any specific questions about what was found during the examination.  If the procedure report does not answer your questions, please call your gastroenterologist to clarify.  If you requested that your care partner not be given the details of your procedure findings, then the procedure report has been included in a sealed envelope for you to review at your convenience later.  YOU SHOULD EXPECT: Some feelings of bloating in the abdomen. Passage of more gas than usual.  Walking can help get rid of the air that was put into your GI tract during the procedure and reduce the bloating. If you had a lower endoscopy (such as a colonoscopy or flexible sigmoidoscopy) you may notice spotting of blood in your stool or on the toilet paper. If you underwent a bowel prep for your procedure, you may not have a normal bowel movement for a few days.  Please Note:  You might notice some irritation and congestion in your nose or some drainage.  This is from the oxygen used during your procedure.  There is no need for concern and it should clear up in a day or so.  SYMPTOMS TO REPORT IMMEDIATELY:  Following lower endoscopy (colonoscopy or flexible sigmoidoscopy):  Excessive amounts of blood in the stool  Significant tenderness or worsening of abdominal pains  Swelling of the abdomen that is new, acute  Fever of 100F or higher  For urgent or emergent issues, a gastroenterologist can be reached at any hour by calling [phone]. Do not use MyChart messaging for urgent concerns.    DIET:  We do recommend a small meal at first, but then you may proceed to your regular diet.  Drink plenty of fluids but you should  avoid alcoholic beverages for 24 hours.  ACTIVITY:  You should plan to take it easy for the rest of today and you should NOT DRIVE or use heavy machinery until tomorrow (because of the sedation medicines used during the test).    FOLLOW UP: Our staff will call the number listed on your records the next business day following your procedure.  We will call around 7:15- 8:00 am to check on you and address any questions or concerns that you may have regarding the information given to you following your procedure. If we do not reach you, we will leave a message.     If any biopsies were taken you will be contacted by phone or by letter within the next 1-3 weeks.  Please call us  at [phone] if you have not heard about the biopsies in 3 weeks.    SIGNATURES/CONFIDENTIALITY: You and/or your care partner have signed paperwork which will be entered into your electronic medical record.  These signatures attest to the fact that that the information above on your After Visit Summary has been reviewed and is understood.  Full responsibility of the confidentiality of this discharge information lies with you and/or your care-partner.

## 2023-08-02 ENCOUNTER — Telehealth: Payer: Self-pay | Admitting: *Deleted

## 2023-08-02 NOTE — Telephone Encounter (Signed)
 Attempted f/u phone call. No answer. Left message.

## 2023-08-06 LAB — SURGICAL PATHOLOGY

## 2023-08-09 ENCOUNTER — Ambulatory Visit: Payer: Self-pay | Admitting: Gastroenterology

## 2023-10-01 ENCOUNTER — Encounter: Payer: Self-pay | Admitting: Gastroenterology

## 2023-10-01 ENCOUNTER — Other Ambulatory Visit: Payer: Self-pay

## 2023-10-01 DIAGNOSIS — Z800 Family history of malignant neoplasm of digestive organs: Secondary | ICD-10-CM

## 2023-10-10 DIAGNOSIS — H524 Presbyopia: Secondary | ICD-10-CM | POA: Diagnosis not present

## 2023-10-10 DIAGNOSIS — H11153 Pinguecula, bilateral: Secondary | ICD-10-CM | POA: Diagnosis not present

## 2023-10-10 DIAGNOSIS — H5213 Myopia, bilateral: Secondary | ICD-10-CM | POA: Diagnosis not present

## 2023-10-10 DIAGNOSIS — H2513 Age-related nuclear cataract, bilateral: Secondary | ICD-10-CM | POA: Diagnosis not present

## 2023-10-11 ENCOUNTER — Ambulatory Visit (HOSPITAL_COMMUNITY)

## 2023-10-14 ENCOUNTER — Other Ambulatory Visit

## 2023-10-28 ENCOUNTER — Ambulatory Visit (INDEPENDENT_AMBULATORY_CARE_PROVIDER_SITE_OTHER)

## 2023-10-28 DIAGNOSIS — Z129 Encounter for screening for malignant neoplasm, site unspecified: Secondary | ICD-10-CM

## 2023-10-28 DIAGNOSIS — Z800 Family history of malignant neoplasm of digestive organs: Secondary | ICD-10-CM | POA: Diagnosis not present

## 2023-10-28 DIAGNOSIS — D1809 Hemangioma of other sites: Secondary | ICD-10-CM | POA: Diagnosis not present

## 2023-10-28 DIAGNOSIS — D1803 Hemangioma of intra-abdominal structures: Secondary | ICD-10-CM | POA: Diagnosis not present

## 2023-10-28 MED ORDER — GADOBUTROL 1 MMOL/ML IV SOLN
7.2000 mL | Freq: Once | INTRAVENOUS | Status: AC | PRN
  Administered 2023-10-28: 7.2 mL via INTRAVENOUS

## 2023-10-30 ENCOUNTER — Ambulatory Visit: Payer: Self-pay | Admitting: Gastroenterology

## 2023-11-11 ENCOUNTER — Other Ambulatory Visit

## 2023-11-22 ENCOUNTER — Other Ambulatory Visit: Payer: Self-pay | Admitting: Internal Medicine

## 2023-11-26 ENCOUNTER — Other Ambulatory Visit

## 2023-11-26 ENCOUNTER — Telehealth: Payer: Self-pay | Admitting: Internal Medicine

## 2023-11-26 NOTE — Telephone Encounter (Signed)
 Pt called and wanted a refill on the medication  montelukast  (SINGULAIR ) 10 MG tablet And a another medication for asthma I believe and requested a call back.

## 2023-11-27 NOTE — Telephone Encounter (Signed)
 Left message for patient to contact office to schedule appointment for a courtesy refill.

## 2023-11-28 ENCOUNTER — Ambulatory Visit (INDEPENDENT_AMBULATORY_CARE_PROVIDER_SITE_OTHER): Admitting: Family

## 2023-11-28 ENCOUNTER — Encounter: Payer: Self-pay | Admitting: Family

## 2023-11-28 VITALS — BP 122/66 | HR 69 | Temp 98.9°F | Resp 20 | Ht 66.0 in | Wt 159.0 lb

## 2023-11-28 DIAGNOSIS — H1010 Acute atopic conjunctivitis, unspecified eye: Secondary | ICD-10-CM

## 2023-11-28 DIAGNOSIS — F40231 Fear of injections and transfusions: Secondary | ICD-10-CM

## 2023-11-28 DIAGNOSIS — H1013 Acute atopic conjunctivitis, bilateral: Secondary | ICD-10-CM

## 2023-11-28 DIAGNOSIS — J301 Allergic rhinitis due to pollen: Secondary | ICD-10-CM | POA: Diagnosis not present

## 2023-11-28 DIAGNOSIS — T63441D Toxic effect of venom of bees, accidental (unintentional), subsequent encounter: Secondary | ICD-10-CM

## 2023-11-28 DIAGNOSIS — J4540 Moderate persistent asthma, uncomplicated: Secondary | ICD-10-CM | POA: Diagnosis not present

## 2023-11-28 MED ORDER — FLUTICASONE PROPIONATE 50 MCG/ACT NA SUSP: NASAL | 11 refills | Status: AC

## 2023-11-28 MED ORDER — CETIRIZINE HCL 10 MG PO CAPS: ORAL_CAPSULE | ORAL | 11 refills | Status: AC

## 2023-11-28 MED ORDER — NEFFY 2 MG/0.1ML NA SOLN: 1.0000 | NASAL | 1 refills | Status: AC | PRN

## 2023-11-28 MED ORDER — AZELASTINE HCL 0.1 % NA SOLN: NASAL | 11 refills | Status: AC

## 2023-11-28 MED ORDER — FLUTICASONE-SALMETEROL 100-50 MCG/ACT IN AEPB: 1.0000 | INHALATION_SPRAY | Freq: Two times a day (BID) | RESPIRATORY_TRACT | 11 refills | Status: AC

## 2023-11-28 MED ORDER — MONTELUKAST SODIUM 10 MG PO TABS: 10.0000 mg | ORAL_TABLET | Freq: Every day | ORAL | 3 refills | Status: AC

## 2023-11-28 MED ORDER — ALBUTEROL SULFATE HFA 108 (90 BASE) MCG/ACT IN AERS: INHALATION_SPRAY | RESPIRATORY_TRACT | 1 refills | Status: AC

## 2023-11-28 MED ORDER — TRIAMCINOLONE ACETONIDE 0.1 % EX OINT: TOPICAL_OINTMENT | CUTANEOUS | 1 refills | Status: AC

## 2023-11-28 NOTE — Progress Notes (Signed)
 [address] [place] [address] Dept: [phone]  FOLLOW UP NOTE  Patient ID: Vanessa Goodman, female    DOB: 1965-10-23  Age: 58 y.o. MRN: [id number] Date of Office Visit: 11/28/2023  Assessment  Chief Complaint: Medication Refill and Asthma  HPI Vanessa Goodman is a 58 year old female who presents today for follow-up of moderate persistent asthma without complication, allergic rhinitis,allergic conjunctivitis, contact dermatitis, and stinging insects allergy .  She was last seen on October 30, 2022 by Dr. Verita.  She denies any new diagnosis or surgery since her last office visit.  Moderate persistent asthma: She continues to take montelukast  10 mg daily and Wixela 100 mcg 1 puff twice a day.  She has albuterol  to use as needed.  She denies cough, wheeze, tightness in chest, shortness of breath, and nocturnal awakenings due to breathing problems.  Since her last office visit she has not required any systemic steroids or made any trips to the emergency room or urgent care due to breathing problems.  She has not had to use her albuterol  inhaler since we last saw her.  She reports that she only uses her albuterol  when she is sick or has bronchitis.  Allergic rhinitis: Her allergies tend to flare in the spring and the fall.  She has a little bit of postnasal drip today that is causing her to have a gravelly voice.  She denies rhinorrhea and nasal congestion today.  She also denies sinus pressure and tenderness.  She has not been treated for any sinus infections since we last saw her.  She takes cetirizine  10 mg daily, Flonase  nasal spray as needed, and saline rinses as needed.  Allergic conjunctivitis: She denies itchy watery eyes right now.  This occurs more in the spring.  Contact dermatitis: She reports that this occurs mainly in the summer.  She notices it when she is playing golf.  It occurs on her lower legs only.  She thinks it is either grass or chemicals on the grass.  1 time it  occurred  while she was wearing tube socks and playing golf.  She will use triamcinolone  and it does go away.  The area when there is not itchy.  She mentions that every female and her family will get this.  When she does have this occur she reports that it looks terrible.  Stinging insect allergy : She has not had any insect stings since her last office visit.  She is interested in seeing if she can get Neffy  due to her severe fear of needles and being out in the hot sun playing golf often.  She reports ever since being a young child that she has had a fear of needles.  She is not interested in getting venom immunotherapy.   Drug Allergies:  Allergies  Allergen Reactions   Bee Venom Hives and Rash    Review of Systems: Negative except as per HPI   Physical Exam: BP 122/66 (BP Location: Left Arm, Patient Position: Sitting, Cuff Size: Normal)   Pulse 69   Temp 98.9 F (37.2 C) (Oral)   Resp 20   Ht 5' 6 (1.676 m)   Wt 159 lb (72.1 kg)   SpO2 96%   BMI 25.66 kg/m    Physical Exam Constitutional:      Appearance: Normal appearance.  HENT:     Head: Normocephalic and atraumatic.     Comments: Pharynx normal, eyes normal, ears normal, nose normal    Right Ear: Tympanic membrane, ear  canal and external ear normal.     Left Ear: Tympanic membrane, ear canal and external ear normal.     Nose: Nose normal.     Mouth/Throat:     Mouth: Mucous membranes are moist.     Pharynx: Oropharynx is clear.  Eyes:     Conjunctiva/sclera: Conjunctivae normal.  Cardiovascular:     Rate and Rhythm: Regular rhythm.     Heart sounds: Normal heart sounds.  Pulmonary:     Effort: Pulmonary effort is normal.     Breath sounds: Normal breath sounds.     Comments: Lungs clear to auscultation Musculoskeletal:     Cervical back: Neck supple.  Skin:    General: Skin is warm.  Neurological:     Mental Status: She is alert and oriented to person, place, and time.  Psychiatric:        Mood and  Affect: Mood normal.        Behavior: Behavior normal.        Thought Content: Thought content normal.        Judgment: Judgment normal.     Diagnostics: FVC 3.34 L (95%), FEV1 2.84 L (102%), FEV1/FVC 0.85.  Spirometry indicates normal spirometry.  Assessment and Plan: 1. Seasonal allergic rhinitis due to pollen   2. Moderate persistent asthma without complication   3. Toxic effect of venom of bees, unintentional, subsequent encounter   4. Seasonal allergic conjunctivitis   5. Severe needle phobia     Meds ordered this encounter  Medications   montelukast  (SINGULAIR ) 10 MG tablet    Sig: Take 1 tablet (10 mg total) by mouth at bedtime.    Dispense:  90 tablet    Refill:  3   fluticasone -salmeterol (WIXELA INHUB) 100-50 MCG/ACT AEPB    Sig: Inhale 1 puff into the lungs 2 (two) times daily. Rinse mouth out afterwards    Dispense:  60 each    Refill:  11   fluticasone  (FLONASE ) 50 MCG/ACT nasal spray    Sig: Place 2 sprays in each nostril once a day as needed for stuffy nose    Dispense:  16 g    Refill:  11   Cetirizine  HCl 10 MG CAPS    Sig: Take 1 tablet by mouth once a day as needed for runny nose    Dispense:  30 capsule    Refill:  11   triamcinolone  ointment (KENALOG ) 0.1 %    Sig: Use 1 application sparingly twice a day as needed to red itchy areas. Do not use on face, neck, groin, or armpit region. Do not use longer than 7-10 days in a row    Dispense:  30 g    Refill:  1   albuterol  (VENTOLIN  HFA) 108 (90 Base) MCG/ACT inhaler    Sig: Inhale 2 puffs every 4-6 hours as needed for cough, wheeze, tightness chest, or shortness of breath    Dispense:  8 g    Refill:  1   azelastine  (ASTELIN ) 0.1 % nasal spray    Sig: Placed 1 to 2 sprays in each nostril once or twice a day as needed for runny nose/drainage down throat    Dispense:  30 mL    Refill:  11   EPINEPHrine  (NEFFY ) 2 MG/0.1ML SOLN    Sig: Place 1 spray into the nose as needed (for anaphylactic reaction).     Dispense:  6 each    Refill:  1    Severe needle phobia.[id number]  Patient Instructions  Asthma- controlled Continue montelukast  10 mg-take 1 tablet once a day to prevent coughing or wheezing Continue Wixela 100mcg 1 puff twice daily  Continue albuterol  2 puffs every 4 hours if needed for wheezing or coughing spells.   You may use albuterol  2 puffs 5 to 15 minutes before exercise to decrease cough or wheeze  Asthma control goals:  Full participation in all desired activities (may need albuterol  before activity) Albuterol  use two time or less a week on average (not counting use with activity) Cough interfering with sleep two time or less a month Oral steroids no more than once a year No hospitalizations  Allergic rhinitis: moderately controlled Start Astelin  (azelastine ) nasal spray 1-2 sprays in each nostril once to twice a day as needed for runny nose/drainage down throat. If insurance does not cover this medication you can buy it over the counter as Astepro  Continue cetirizine  10 mg-take 1 tablet once a day as needed for runny nose or itchy eyes Continue Flonase  2 sprays per nostril once a day if needed for stuffy nose. In the right nostril, point the applicator out toward the right ear. In the left nostril, point the applicator out toward the left ear Consider saline nasal rinses as needed for nasal symptoms. Use this before any medicated nasal sprays for best result Continue allergen avoidance measures directed toward, grass pollen, tree pollen, weed pollen, cat, and dust mite as listed below  Allergic conjunctivitis:  controlled  Some over the counter eye drops include Pataday  one drop in each eye once a day as needed for red, itchy eyes OR Zaditor one drop in each eye twice a day as needed for red itchy eyes.  Contact Dermatitis - For flares start triamcinolone  0.1% ointment twice a day until rash resolves   Stinging insects Avoid stinging insects. In case of an allergic  reaction, take Zyrtec  (cetirizine ) 10 mg every 12-24 hours, and if life-threatening symptoms occur, inject with EpiPen  0.3 mg or Neffy  2 mg  -Demonstration given on how to use Neffy  - Updated Emergency Action Plan given and reviewed - recommend medical alert bracelet - practice avoidance measures as outlined below when possible    Call the clinic if this treatment plan is not working well for you  Follow up in 12 months or sooner if problems   Thank you so much for letting me partake in your care today.  Don't hesitate to reach out if you have any additional concerns!      Return in about 1 year (around 11/27/2024), or if symptoms worsen or fail to improve.    Thank you for the opportunity to care for this patient.  Please do not hesitate to contact me with questions.  Wanda Craze, FNP [facility] 

## 2023-11-28 NOTE — Patient Instructions (Addendum)
 Asthma- controlled Continue montelukast  10 mg-take 1 tablet once a day to prevent coughing or wheezing Continue Wixela 100mcg 1 puff twice daily  Continue albuterol  2 puffs every 4 hours if needed for wheezing or coughing spells.   You may use albuterol  2 puffs 5 to 15 minutes before exercise to decrease cough or wheeze  Asthma control goals:  Full participation in all desired activities (may need albuterol  before activity) Albuterol  use two time or less a week on average (not counting use with activity) Cough interfering with sleep two time or less a month Oral steroids no more than once a year No hospitalizations  Allergic rhinitis: moderately controlled Start Astelin  (azelastine ) nasal spray 1-2 sprays in each nostril once to twice a day as needed for runny nose/drainage down throat. If insurance does not cover this medication you can buy it over the counter as Astepro  Continue cetirizine  10 mg-take 1 tablet once a day as needed for runny nose or itchy eyes Continue Flonase  2 sprays per nostril once a day if needed for stuffy nose. In the right nostril, point the applicator out toward the right ear. In the left nostril, point the applicator out toward the left ear Consider saline nasal rinses as needed for nasal symptoms. Use this before any medicated nasal sprays for best result Continue allergen avoidance measures directed toward, grass pollen, tree pollen, weed pollen, cat, and dust mite as listed below  Allergic conjunctivitis:  controlled  Some over the counter eye drops include Pataday  one drop in each eye once a day as needed for red, itchy eyes OR Zaditor one drop in each eye twice a day as needed for red itchy eyes.  Contact Dermatitis - For flares start triamcinolone  0.1% ointment twice a day until rash resolves   Stinging insects Avoid stinging insects. In case of an allergic reaction, take Zyrtec  (cetirizine ) 10 mg every 12-24 hours, and if life-threatening symptoms occur,  inject with EpiPen  0.3 mg or Neffy  2 mg  -Demonstration given on how to use Neffy  - Updated Emergency Action Plan given and reviewed - recommend medical alert bracelet - practice avoidance measures as outlined below when possible    Call the clinic if this treatment plan is not working well for you  Follow up in 12 months or sooner if problems   Thank you so much for letting me partake in your care today.  Don't hesitate to reach out if you have any additional concerns!

## 2023-12-04 ENCOUNTER — Ambulatory Visit: Admitting: Internal Medicine

## 2023-12-04 DIAGNOSIS — N951 Menopausal and female climacteric states: Secondary | ICD-10-CM | POA: Diagnosis not present

## 2023-12-04 DIAGNOSIS — R5383 Other fatigue: Secondary | ICD-10-CM | POA: Diagnosis not present

## 2023-12-04 DIAGNOSIS — R6882 Decreased libido: Secondary | ICD-10-CM | POA: Diagnosis not present

## 2023-12-04 DIAGNOSIS — E611 Iron deficiency: Secondary | ICD-10-CM | POA: Diagnosis not present

## 2023-12-04 DIAGNOSIS — E039 Hypothyroidism, unspecified: Secondary | ICD-10-CM | POA: Diagnosis not present

## 2023-12-17 DIAGNOSIS — E039 Hypothyroidism, unspecified: Secondary | ICD-10-CM | POA: Diagnosis not present

## 2023-12-17 DIAGNOSIS — R5383 Other fatigue: Secondary | ICD-10-CM | POA: Diagnosis not present

## 2023-12-17 DIAGNOSIS — N951 Menopausal and female climacteric states: Secondary | ICD-10-CM | POA: Diagnosis not present

## 2023-12-17 DIAGNOSIS — E611 Iron deficiency: Secondary | ICD-10-CM | POA: Diagnosis not present

## 2024-01-08 ENCOUNTER — Telehealth: Payer: Self-pay

## 2024-01-08 NOTE — Telephone Encounter (Signed)
 Ms. Geryl provided updates to her family history.   She reports a paternal cousin, Darice, with a new diagnosis of pancreatic cancer at 61. Ms. Geryl is unsure if she got genetic testing. I recommend that Darice, and Karen's two brothers pursue genetic testing. One of Karen's brothers also has a diagnosis of  lung cancer. Darice has one daughter is is 28-56 years old, and I recommended she pursue genetic testing if Darice tests positive.   She reports her sister, Tamera, had genetic testing and was negative. Sheree has 3 daughters. One daughter was reportedly negative and two other daughters had an amorphous gene.  I also recommended her maternal cousin with a diagnosis of breast cancer I her 26s consider genetic testing and her sister, Carlyon, consider genetic testing due to her mother being diagnosed under the age of 52 with unknown genetic testing status and her father's diagnosis of pancreatic cancer with unknown genetic testing status.   This family history has been updated. A copy of the updated family history is also below.

## 2024-04-09 ENCOUNTER — Ambulatory Visit: Admitting: Gastroenterology

## 2024-04-09 ENCOUNTER — Encounter: Payer: Self-pay | Admitting: Gastroenterology

## 2024-04-09 ENCOUNTER — Ambulatory Visit: Payer: Self-pay | Admitting: Gastroenterology

## 2024-04-09 ENCOUNTER — Other Ambulatory Visit (INDEPENDENT_AMBULATORY_CARE_PROVIDER_SITE_OTHER)

## 2024-04-09 VITALS — BP 126/80 | HR 78 | Ht 66.0 in | Wt 162.0 lb

## 2024-04-09 DIAGNOSIS — Z800 Family history of malignant neoplasm of digestive organs: Secondary | ICD-10-CM

## 2024-04-09 DIAGNOSIS — Z9189 Other specified personal risk factors, not elsewhere classified: Secondary | ICD-10-CM | POA: Diagnosis not present

## 2024-04-09 DIAGNOSIS — Z860100 Personal history of colon polyps, unspecified: Secondary | ICD-10-CM | POA: Diagnosis not present

## 2024-04-09 DIAGNOSIS — D1803 Hemangioma of intra-abdominal structures: Secondary | ICD-10-CM

## 2024-04-09 DIAGNOSIS — Z83719 Family history of colon polyps, unspecified: Secondary | ICD-10-CM | POA: Diagnosis not present

## 2024-04-09 LAB — HEMOGLOBIN A1C: Hgb A1c MFr Bld: 5.7 % (ref 4.6–6.5)

## 2024-04-09 NOTE — Patient Instructions (Addendum)
 _______________________________________________________  If your blood pressure at your visit was 140/90 or greater, please contact your primary care physician to follow up on this.  _______________________________________________________  If you are age 57 or older, your body mass index should be between 23-30. Your Body mass index is 26.15 kg/m. If this is out of the aforementioned range listed, please consider follow up with your Primary Care Provider.  If you are age 34 or younger, your body mass index should be between 19-25. Your Body mass index is 26.15 kg/m. If this is out of the aformentioned range listed, please consider follow up with your Primary Care Provider.   ________________________________________________________  The [facility] providers would like to encourage you to use MYCHART to communicate with providers for non-urgent requests or questions.  Due to long hold times on the telephone, sending your provider a message by [facility] may be a faster and more efficient way to get a response.  Please allow 48 business hours for a response.  Please remember that this is for non-urgent requests.  _______________________________________________________  [facility] is using a team-based approach to care.  Your team is made up of your doctor and two to three APPS. Our APPS (Nurse Practitioners and Physician Assistants) work with your physician to ensure care continuity for you. They are fully qualified to address your health concerns and develop a treatment plan. They communicate directly with your gastroenterologist to care for you. Seeing the Advanced Practice Practitioners on your physician's team can help you by facilitating care more promptly, often allowing for earlier appointments, access to diagnostic testing, procedures, and other specialty referrals.   Your provider has requested that you go to the basement level for lab work before leaving today. Press B on the  elevator. The lab is located at the first door on the left as you exit the elevator.  You will need an EUS with Dr Wilhelmenia in August 2026.  We will contact you to schedule this closer to that time.  Due to recent changes in healthcare laws, you may see the results of your imaging and laboratory studies on MyChart before your provider has had a chance to review them.  We understand that in some cases there may be results that are confusing or concerning to you. Not all laboratory results come back in the same time frame and the provider may be waiting for multiple results in order to interpret others.  Please give us  48 hours in order for your provider to thoroughly review all the results before contacting the office for clarification of your results.   It was a pleasure to see you today!  Vanessa Goodman, D.O.

## 2024-04-09 NOTE — Progress Notes (Signed)
 "  Chief Complaint:    Family history of Pancreatic Cancer, Pancreatic Cancer screening  GI History:  Vanessa Goodman is a 59 y.o. 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 Her father was diagnosed with metastatic Pancreatic Cancer in 2021-05-04, and died within just 3 weeks of diagnosis.     Family history: - Mother: Breast cancer age 79, large colon polyp requiring surgical resection age 85 - Father: Pancreatic cancer age 97 - Sister: Breast cancer age 81 - Paternal aunt: Pancreatic cancer age 62 - PGF: Lung cancer - Maternal cousin: Breast cancer in her 53s - Paternal cousin: Pancreatic cancer at 21 (daughter of the aunt with PC)  Genetic Testing results from 06/2021: No pathogenic mutations   Pancreatic Cancer screening protocol: Up-to-date with most recent studies as below - Initiated screening in 07/2021 05-May-2023: Hgb A1c 5.9% - 10/28/2023: MRI/MRCP: Normal (incidental 9 mm hepatic hemangioma, stable from prior) - 10/29/2022: EUS: Normal pancreas.  Focal hyperechoic echotexture in the left lobe of the liver measuring 12 x 10 mm, otherwise normal liver, CBD.  2 cm HH, J-shaped stomach, mild antral gastritis (path benign)   2) Family history of advanced colon polyps   Endoscopic History: - Colonoscopy (07/2014, [facility]): Normal per patient. No report available for review, but recommended repeat in 5 years d/t Fhx - Colonoscopy (08/2021, Dr. San): Moderate stool throughout with fair prep.  Normal visualized colon.  Normal TI.  Repeat in 2 years due to suboptimal bowel preparation. - Colonoscopy (07/2023, Dr. San): 3 mm sigmoid colon adenoma, otherwise normal.  Moderate looping.  Repeat in 5 years due to family history.  HPI:     Patient is a 59 y.o.  female presenting to the Gastroenterology Clinic for routine follow-up.  Was last seen by me on 04/25/2023 for routine follow-up for PC screening protocol and history of colon polyps.  No issues at that time.  Since then, completed colonoscopy and MRI/MRCP as above.  Since last appointment, her cousin was diagnosed with Pancreatic Cancer at age 59.  This cousin is the daughter of her paternal aunt who died of Pancreatic Cancer at 43.  Today, does report having abdominal bloating, but reports only minimally bothersome and not terribly changed from baseline.  Otherwise feeling well and no active issues or concerns today.  Has been working with [employer] on HRT.  Review of systems:     No chest pain, no SOB, no fevers, no urinary sx   Past Medical History:  Diagnosis Date   Allergy     seasonal   Asthma    Breast cancer (HCC) 05/04/04   dx at 88, left   Recurrent upper respiratory infection (URI)     Patient's surgical history, family medical history, social history, medications and allergies were all reviewed in Epic    Current Outpatient Medications  Medication Sig Dispense Refill   albuterol  (PROAIR  HFA) 108 (90 Base) MCG/ACT inhaler 2 puffs every 4 hours if needed for wheezing or coughing spells. 1 each 3   albuterol  (VENTOLIN  HFA) 108 (90 Base) MCG/ACT inhaler Inhale 2 puffs every 4-6 hours as needed for cough, wheeze, tightness chest, or shortness of breath 8 g 1   AMBULATORY NON FORMULARY MEDICATION Medication Name: Testosterone  5mg  sublingual every morning.  Ascorbic Acid (VITAMIN C) 1000 MG tablet Take 1,000 mg by mouth daily.     azelastine  (ASTELIN ) 0.1 % nasal spray Placed 1 to 2 sprays in each nostril once or twice a day as needed for runny nose/drainage down throat 30 mL 11   Cetirizine  HCl 10 MG CAPS Take 1 tablet by mouth once a day as needed for runny nose 30 capsule 11   EPINEPHrine  (NEFFY ) 2 MG/0.1ML SOLN Place 1 spray into the nose as needed (for anaphylactic  reaction). 6 each 1   estradiol  (ESTRACE ) 1 MG tablet Take 1 mg by mouth daily.     fluticasone  (FLONASE ) 50 MCG/ACT nasal spray Place 2 sprays in each nostril once a day as needed for stuffy nose 16 g 11   fluticasone -salmeterol (WIXELA INHUB) 100-50 MCG/ACT AEPB Inhale 1 puff into the lungs 2 (two) times daily. Rinse mouth out afterwards 60 each 11   montelukast  (SINGULAIR ) 10 MG tablet Take 1 tablet (10 mg total) by mouth at bedtime. 90 tablet 3   NP THYROID  60 MG tablet TAKE 1 TABLET BY MOUTH EVERY MORNING 30 MINUTES PRIOR TO EATING OR DRINKING     Omega-3 Fatty Acids (OMEGA-3 PO) Take by mouth. 400/300MG  one tablet daily     Pregnenolone Micronized 50 MG TABS Take 75 mg by mouth daily.     progesterone  (PROMETRIUM ) 200 MG capsule Take 400 mg by mouth daily.     triamcinolone  ointment (KENALOG ) 0.1 % Use 1 application sparingly twice a day as needed to red itchy areas. Do not use on face, neck, groin, or armpit region. Do not use longer than 7-10 days in a row 30 g 1   Vitamin D-Vitamin K (VITAMIN K2-VITAMIN D3 PO) Take 5,000 Int'l Units/1.7m2 by mouth daily.     No current facility-administered medications for this visit.    Physical Exam:     BP 126/80   Pulse 78   Ht 5' 6 (1.676 m)   Wt 162 lb (73.5 kg)   SpO2 100%   BMI 26.15 kg/m   GENERAL:  Pleasant female in NAD PSYCH: : Cooperative, normal affect Musculoskeletal:  Normal muscle tone, normal strength NEURO: Alert and oriented x 3, no focal neurologic deficits   IMPRESSION and PLAN:    1) Family History of Pancreatic Cancer 2) Personal Hx of Breast CA Continue PC screening protocol per familial [facility] guidelines.  Strong family history of Pancreatic Cancer, including another cousin diagnosed over the last year with PC.  Currently UTD on screening - EUS in 10/2024 for continued PC screening, and if unremarkable/normal, continue with annual screening in alternating fashion between EUS and MRI/MRCP - Check hemoglobin A1c  today   3) Family history of advanced colon polyps 4) Personal history of colon polyps Last colonoscopy was 07/2023 and notable for 3 mm tubular adenoma. - Repeat colonoscopy in 07/2028 for ongoing surveillance   6) Hepatic hemangioma EUS in 10/2022 with hyperechoic lesion in the liver.  Subsequent CT in 12/2022 confirmed benign 9 mm hepatic hemangioma with otherwise normal-appearing liver.  No repeat imaging needed for this benign finding.     I spent 30 minutes of time, including in depth chart review, independent review of results as outlined above, communicating results with the patient directly, face-to-face time with the patient, coordinating care, and ordering studies and medications as appropriate, and documentation.       Sandor LULLA Flatter ,DO, FACG 04/09/2024, 10:32 AM  "
